# Patient Record
Sex: Male | Born: 1955 | Race: White | Hispanic: No | Marital: Married | Smoking: Never smoker
Health system: Southern US, Community
[De-identification: ages and names within clinical notes are randomized; demographics above are authoritative.]

## PROBLEM LIST (undated history)

## (undated) DIAGNOSIS — T7840XA Allergy, unspecified, initial encounter: Secondary | ICD-10-CM

## (undated) DIAGNOSIS — J189 Pneumonia, unspecified organism: Secondary | ICD-10-CM

## (undated) DIAGNOSIS — E785 Hyperlipidemia, unspecified: Secondary | ICD-10-CM

## (undated) DIAGNOSIS — J45909 Unspecified asthma, uncomplicated: Secondary | ICD-10-CM

## (undated) DIAGNOSIS — M1990 Unspecified osteoarthritis, unspecified site: Secondary | ICD-10-CM

## (undated) DIAGNOSIS — C61 Malignant neoplasm of prostate: Secondary | ICD-10-CM

## (undated) DIAGNOSIS — Z87442 Personal history of urinary calculi: Secondary | ICD-10-CM

## (undated) HISTORY — DX: Unspecified osteoarthritis, unspecified site: M19.90

## (undated) HISTORY — DX: Malignant neoplasm of prostate: C61

## (undated) HISTORY — DX: Unspecified asthma, uncomplicated: J45.909

## (undated) HISTORY — DX: Allergy, unspecified, initial encounter: T78.40XA

## (undated) HISTORY — PX: REPLACEMENT TOTAL KNEE: SUR1224

## (undated) HISTORY — PX: JOINT REPLACEMENT: SHX530

## (undated) HISTORY — PX: TONSILLECTOMY: SUR1361

## (undated) HISTORY — DX: Hyperlipidemia, unspecified: E78.5

---

## 2008-03-30 ENCOUNTER — Encounter: Admission: RE | Admit: 2008-03-30 | Discharge: 2008-03-30 | Payer: Self-pay | Admitting: Family Medicine

## 2009-08-15 IMAGING — US US ABDOMEN COMPLETE
1 series · 14 of 25 positions shown · non-contrast
Comparison: None available.

CLINICAL DATA: Abdominal fullness.  Weight loss.

COMPLETE ABDOMINAL ULTRASOUND
TECHNIQUE: Complete abdominal ultrasound examination was performed
including evaluation of the liver, gallbladder, bile ducts,
pancreas, kidneys, spleen, IVC, and abdominal aorta.

[Series 1: us abdomen complete · 0.26mm/px · 14 of 75 slices shown]
[im 1/75]
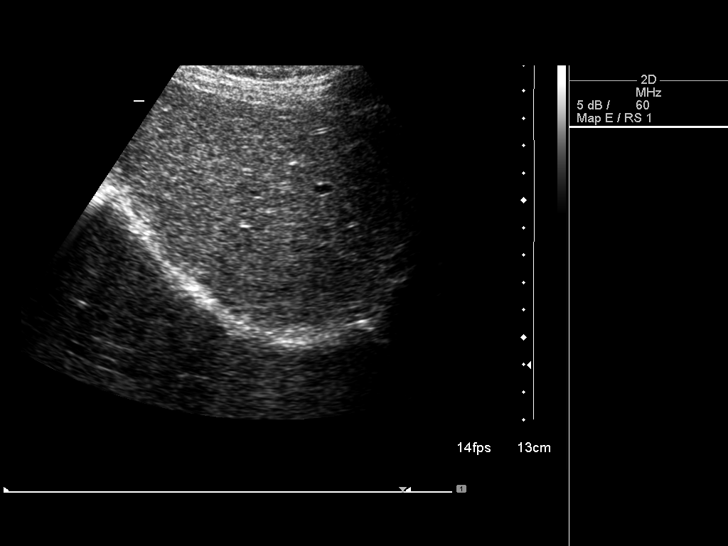
[im 7/75]
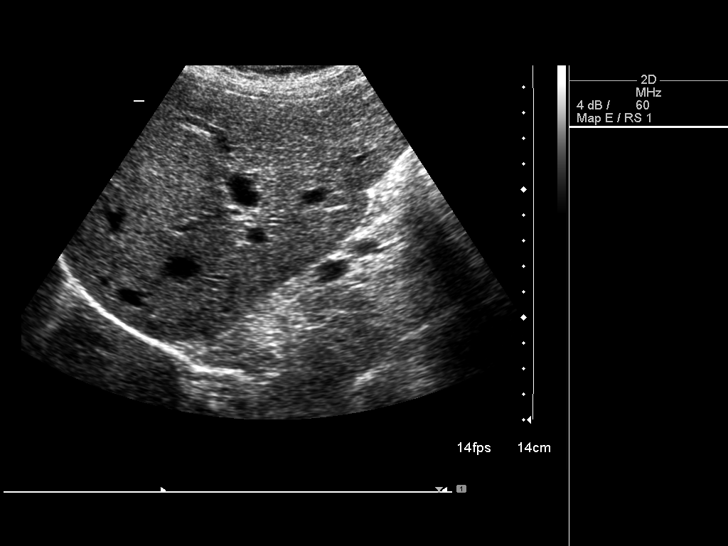
[im 13/75]
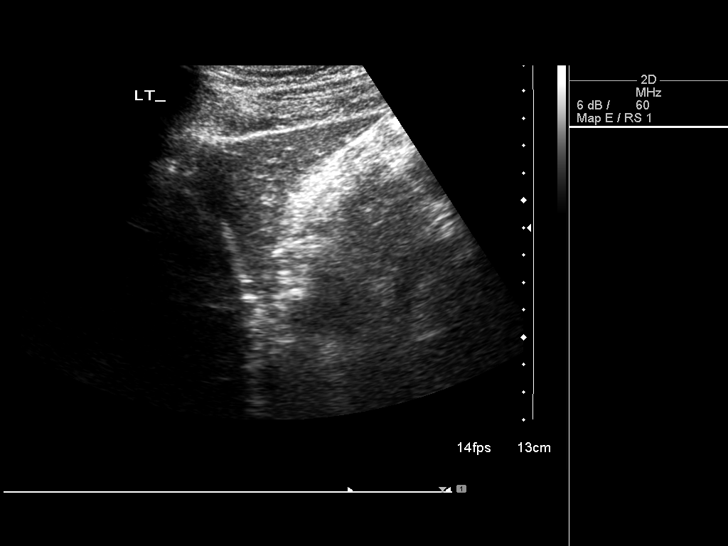
[im 19/75]
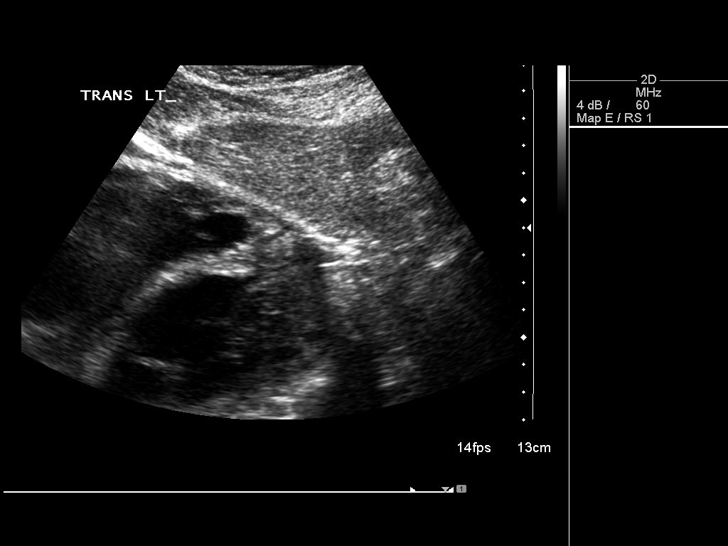
[im 25/75]
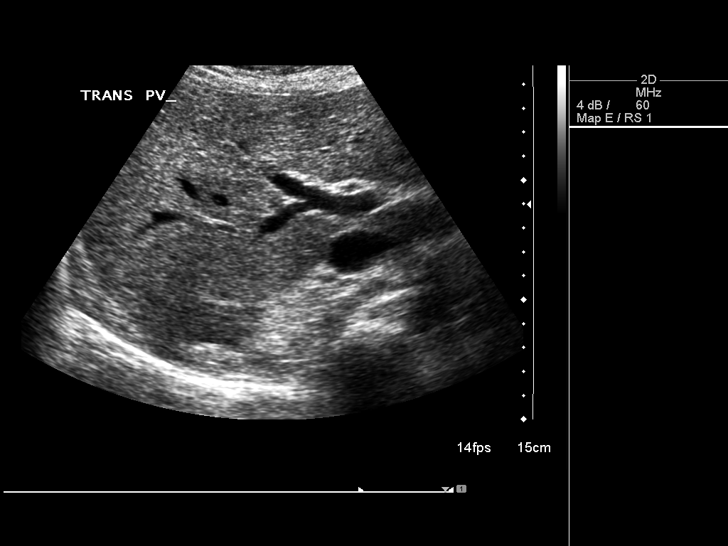
[im 28/75]
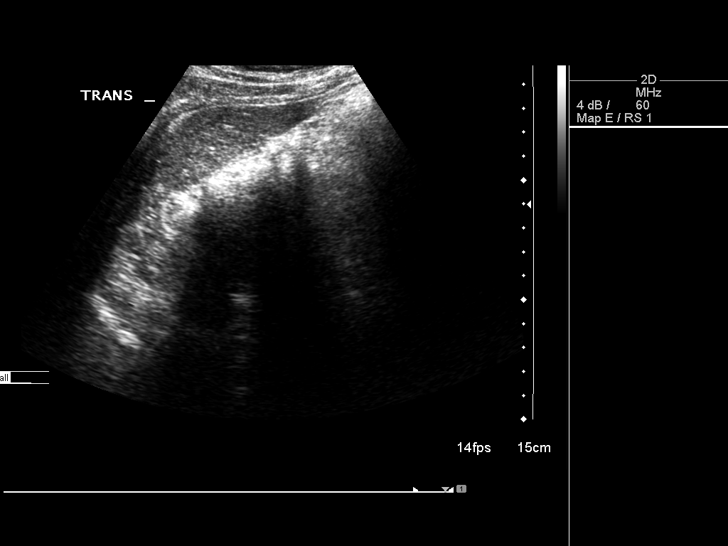
[im 34/75]
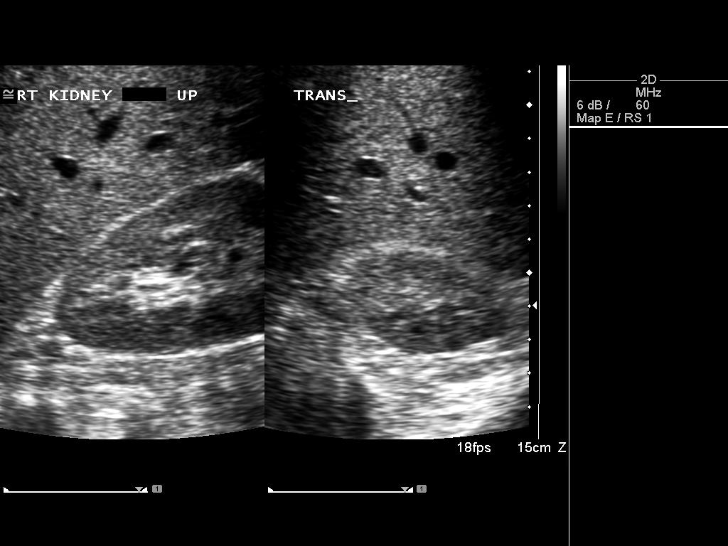
[im 41/75]
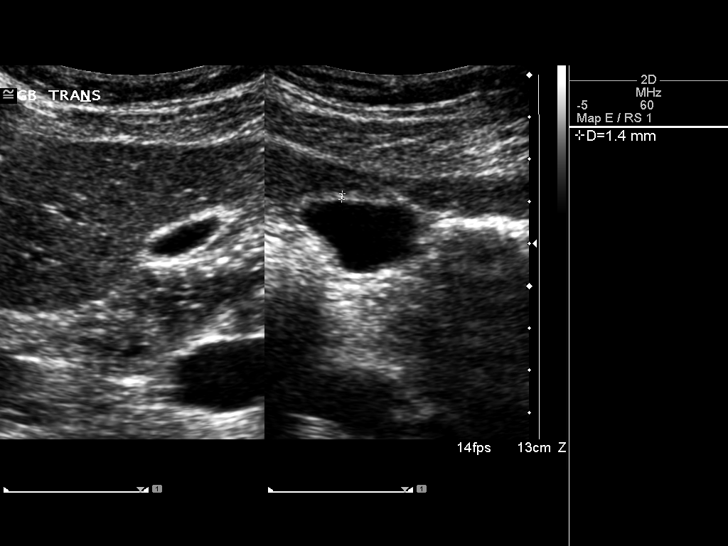
[im 47/75]
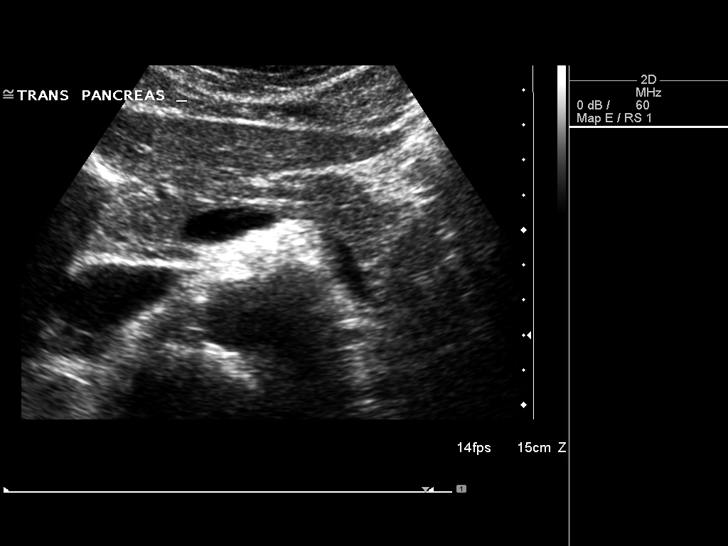
[im 50/75]
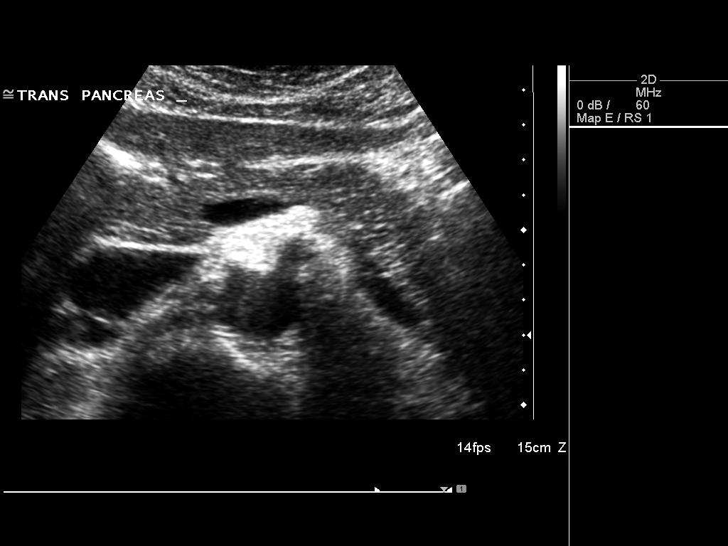
[im 56/75]
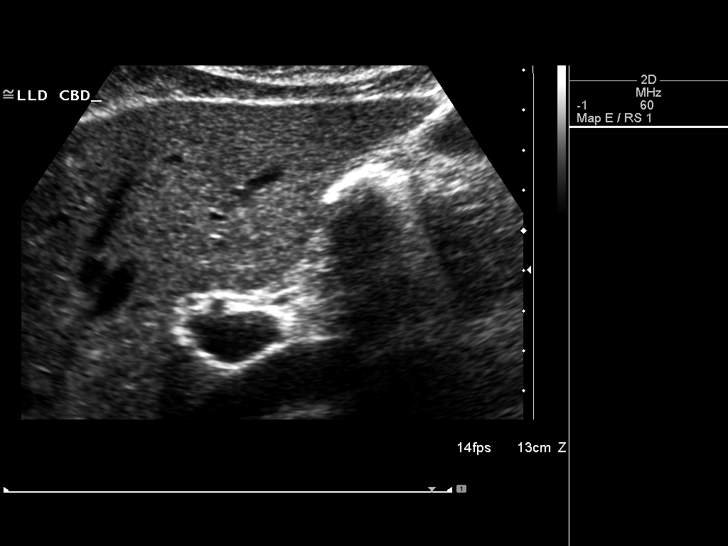
[im 62/75]
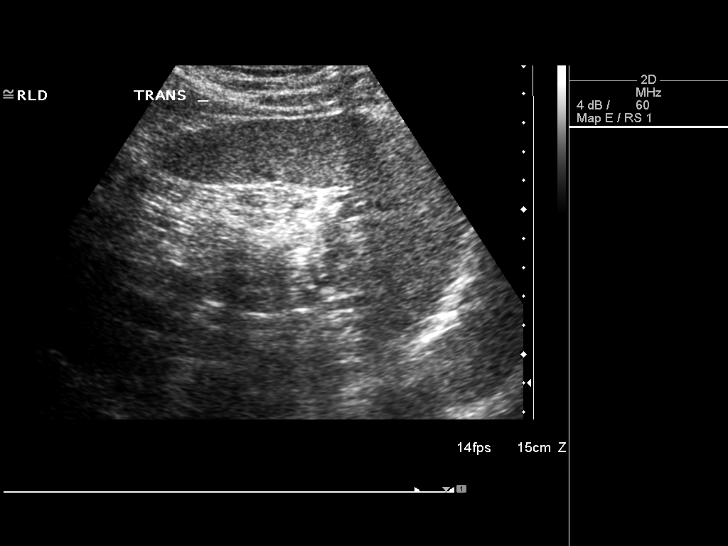
[im 68/75]
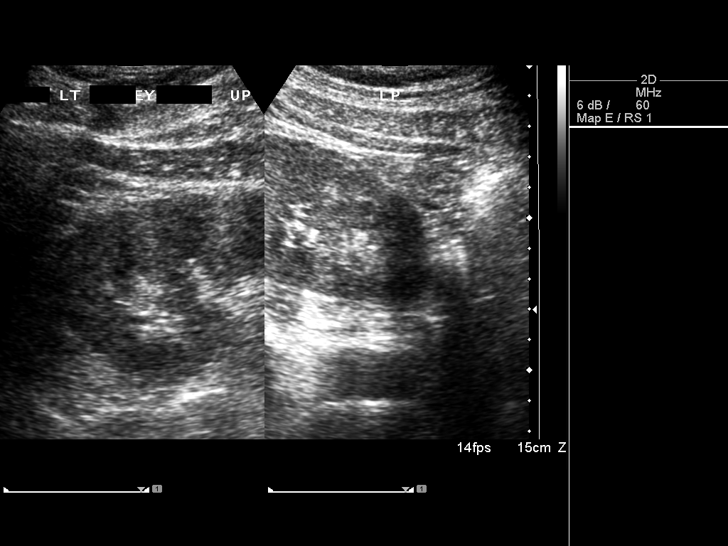
[im 75/75]
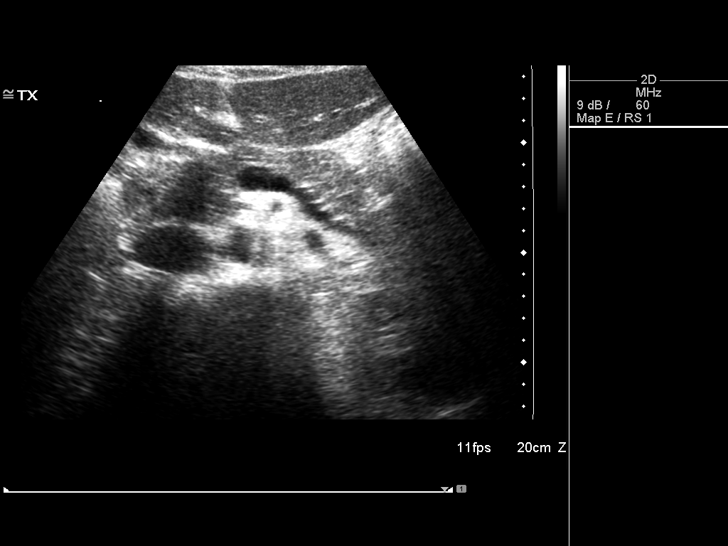

[14 of 25 positions shown; findings below may reference images not displayed]

FINDINGS: Gallbladder:  No gallstones, gallbladder wall thickening, or
pericholecystic fluid.

Common bile duct: Within normal limits in caliber measuring 2 mm in
diameter.

Liver:  No focal parenchymal abnormalities.  Within normal limits
in parenchymal echogenicity.

Inferior vena cava:  Visualized portion unremarkable.

Pancreas:  Visualized portion unremarkable.

Spleen:  Within normal limits in size and echogenicity.

Right kidney:  Within normal limits in size and echogenicity. No
evidence of mass or hydronephrosis. A 0.8 cm cyst in the lower pole
is incidentally noted.

Left kidney:  Within normal limits in size and echogenicity. No
evidence of mass or hydronephrosis.

Abdominal aorta:  Within normal limits in caliber measuring to a
maximum of 2.3 cm.
IMPRESSION: Negative abdominal ultrasound.

## 2009-08-29 LAB — HM COLONOSCOPY

## 2014-09-24 ENCOUNTER — Telehealth: Payer: Self-pay | Admitting: Internal Medicine

## 2014-09-24 ENCOUNTER — Ambulatory Visit (INDEPENDENT_AMBULATORY_CARE_PROVIDER_SITE_OTHER): Payer: Commercial Managed Care - PPO | Admitting: Internal Medicine

## 2014-09-24 ENCOUNTER — Encounter: Payer: Self-pay | Admitting: Internal Medicine

## 2014-09-24 VITALS — BP 121/79 | HR 76 | Temp 97.9°F | Ht 72.0 in | Wt 173.1 lb

## 2014-09-24 DIAGNOSIS — M1990 Unspecified osteoarthritis, unspecified site: Secondary | ICD-10-CM | POA: Insufficient documentation

## 2014-09-24 DIAGNOSIS — E785 Hyperlipidemia, unspecified: Secondary | ICD-10-CM

## 2014-09-24 DIAGNOSIS — Z0000 Encounter for general adult medical examination without abnormal findings: Secondary | ICD-10-CM | POA: Insufficient documentation

## 2014-09-24 DIAGNOSIS — J45909 Unspecified asthma, uncomplicated: Secondary | ICD-10-CM | POA: Insufficient documentation

## 2014-09-24 DIAGNOSIS — M1712 Unilateral primary osteoarthritis, left knee: Secondary | ICD-10-CM

## 2014-09-24 DIAGNOSIS — J4520 Mild intermittent asthma, uncomplicated: Secondary | ICD-10-CM

## 2014-09-24 HISTORY — DX: Unspecified asthma, uncomplicated: J45.909

## 2014-09-24 MED ORDER — BECLOMETHASONE DIPROPIONATE 80 MCG/ACT IN AERS: 2.0000 | INHALATION_SPRAY | Freq: Two times a day (BID) | RESPIRATORY_TRACT | Status: DC

## 2014-09-24 NOTE — Telephone Encounter (Signed)
Pt scheduled lab and CPE appointment in march due to MD availability. Pt would like to know if this is ok.

## 2014-09-24 NOTE — Progress Notes (Signed)
Pre visit review using our clinic review tool, if applicable. No additional management support is needed unless otherwise documented below in the visit note. 

## 2014-09-24 NOTE — Telephone Encounter (Signed)
If that is first available that is fine.

## 2014-09-24 NOTE — Assessment & Plan Note (Signed)
History of asthma as a child, this has been silent for years but in the last 2 falls he has noted a persisting cough. Cough variant  asthma? Plan: Qvar, see instructions.

## 2014-09-24 NOTE — Progress Notes (Signed)
Subjective:    Patient ID: Tommy Mcbride, male    DOB: 02/03/1956, 59 y.o.   MRN: [id number]  DOS:  09/24/2014 Type of visit - description : new pt  Interval history: Chief complaint is cough, this is the second Fall he has a persisting dry cough on and off throughout the day. Basically no symptoms at night. He has a history of asthma that has been inactive for years. Denies wheezing. Also would like to get labs for a physical exam  ROS Denies sinus pain or congestion, no itchy eyes or itchy nose. No chest pain or difficulty breathing No persistent GERD symptoms, occasional heartburn depending on diet. No sputum production  Past Medical History  Diagnosis Date  . 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 Hyperlipidemia   . DJD (degenerative joint disease)     L knee     Past Surgical History  Procedure Laterality Date  . Tonsillectomy      History   Social History  . Marital Status: Married    Spouse Name: N/A    Number of Children: 2  . Years of Education: N/A   Occupational History  . tennis pro    Social History Main Topics  . Smoking status: Never Smoker   . Smokeless tobacco: Never Used  . Alcohol Use: 0.0 oz/week    0 Not specified per week     Comment: Occasional  . Drug Use: Not on file  . Sexual Activity: Not on file   Other Topics Concern  . Not on file   Social History Narrative   Tennis pro, very active     Family History  Problem Relation Age of Onset  . CAD Neg Hx   . Stroke Father 2075  . Breast cancer Mother   . Diabetes Neg Hx   . Colon cancer Neg Hx   . Prostate cancer Neg Hx        Medication List       This list is accurate as of: 09/24/14 11:59 PM.  Always use your most recent med list.               beclomethasone 80 MCG/ACT inhaler  Commonly known as:  QVAR  Inhale 2 puffs into the lungs 2 (two) times daily.     MULTIVITAMIN PO  Take 1 tablet by mouth daily.     OSTEO BI-FLEX ADV DOUBLE ST PO  Take by  mouth. 1 tablet in am and 1 in pm     Policosanol 10 MG Caps  Take by mouth. In evenings     PSYLLIUM HUSK PO  Take by mouth. 1.5 tsps in am     SALMON OIL-1000 PO  Take by mouth. 1 in am and 1 in pm           Objective:   Physical Exam BP 121/79 mmHg  Pulse 76  Temp(Src) 97.9 F (36.6 C) (Oral)  Ht 6' (1.829 m)  Wt 173 lb 2 oz (78.529 kg)  BMI 23.47 kg/m2  SpO2 96%  General -- alert, well-developed, NAD.  HEENT-- Not pale.  R Ear-- normal L ear-- normal Throat symmetric, no redness or discharge. Face symmetric, sinuses not tender to palpation. Nose   congested. Lungs -- normal respiratory effort, no intercostal retractions, no accessory muscle use, and normal breath sounds.  Heart-- normal rate, regular rhythm, no murmur.  Abdomen-- Not distended, good bowel sounds,soft, non-tender. Extremities-- no pretibial edema  bilaterally  Neurologic--  alert & oriented X3. Speech normal, gait appropriate for age, strength symmetric and appropriate for age.  Psych-- Cognition and judgment appear intact. Cooperative with normal attention span and concentration. No anxious or depressed appearing.     Assessment & Plan:

## 2014-09-24 NOTE — Assessment & Plan Note (Signed)
Declined a flu shot Plan to do labs in preparation for his upcoming physical exam, plans to schedule that within few weeks

## 2014-09-24 NOTE — Patient Instructions (Signed)
Stop by the front desk and schedule labs to be done at your convenience  (fasting)   Please come back to the office at your convenience for a physical exam. No fasting   Front desk, please arrange within 4-5 weeks  Qvar 2 puffs twice a day for 3 or 4 weeks, then you can stop and restart if the cough resurface. If  is not helping let me know

## 2014-11-23 ENCOUNTER — Other Ambulatory Visit: Payer: Commercial Managed Care - PPO

## 2014-11-30 ENCOUNTER — Encounter: Payer: Commercial Managed Care - PPO | Admitting: Internal Medicine

## 2015-06-21 ENCOUNTER — Other Ambulatory Visit: Payer: Self-pay | Admitting: Internal Medicine

## 2015-06-21 MED ORDER — TRIAMCINOLONE ACETONIDE 0.1 % EX CREA: 1.0000 "application " | TOPICAL_CREAM | Freq: Two times a day (BID) | CUTANEOUS | Status: DC | PRN

## 2015-11-25 ENCOUNTER — Encounter: Payer: Self-pay | Admitting: *Deleted

## 2015-11-25 ENCOUNTER — Telehealth: Payer: Self-pay | Admitting: *Deleted

## 2015-11-25 NOTE — Telephone Encounter (Signed)
Pre-Visit Call completed with patient and chart updated.   Pre-Visit Info documented in Specialty Comments under Snap[employer]ot.    

## 2015-11-26 ENCOUNTER — Encounter: Payer: Self-pay | Admitting: Internal Medicine

## 2015-11-26 ENCOUNTER — Ambulatory Visit (INDEPENDENT_AMBULATORY_CARE_PROVIDER_SITE_OTHER): Payer: Commercial Managed Care - PPO | Admitting: Internal Medicine

## 2015-11-26 VITALS — BP 116/66 | HR 67 | Temp 97.9°F | Ht 72.0 in | Wt 173.4 lb

## 2015-11-26 DIAGNOSIS — Z23 Encounter for immunization: Secondary | ICD-10-CM | POA: Diagnosis not present

## 2015-11-26 DIAGNOSIS — Z09 Encounter for follow-up examination after completed treatment for conditions other than malignant neoplasm: Secondary | ICD-10-CM | POA: Insufficient documentation

## 2015-11-26 DIAGNOSIS — Z0000 Encounter for general adult medical examination without abnormal findings: Secondary | ICD-10-CM | POA: Diagnosis not present

## 2015-11-26 LAB — COMPREHENSIVE METABOLIC PANEL
ALBUMIN: 4.5 g/dL (ref 3.5–5.2)
ALK PHOS: 71 U/L (ref 39–117)
ALT: 17 U/L (ref 0–53)
AST: 19 U/L (ref 0–37)
BILIRUBIN TOTAL: 1.1 mg/dL (ref 0.2–1.2)
BUN: 16 mg/dL (ref 6–23)
CO2: 27 mEq/L (ref 19–32)
Calcium: 9.4 mg/dL (ref 8.4–10.5)
Chloride: 102 mEq/L (ref 96–112)
Creatinine, Ser: 1 mg/dL (ref 0.40–1.50)
GFR: 81.36 mL/min (ref >60.00)
Glucose, Bld: 96 mg/dL (ref 70–99)
Potassium: 4 mEq/L (ref 3.5–5.1)
SODIUM: 138 meq/L (ref 135–145)
TOTAL PROTEIN: 7 g/dL (ref 6.0–8.3)

## 2015-11-26 LAB — CBC WITH DIFFERENTIAL/PLATELET
BASOS ABS: 0 10*3/uL (ref 0.0–0.1)
Basophils Relative: 0.7 % (ref 0.0–3.0)
Eosinophils Absolute: 0.1 10*3/uL (ref 0.0–0.7)
Eosinophils Relative: 1.9 % (ref 0.0–5.0)
HEMATOCRIT: 45.5 % (ref 39.0–52.0)
Hemoglobin: 15.4 g/dL (ref 13.0–17.0)
LYMPHS ABS: 1.6 10*3/uL (ref 0.7–4.0)
LYMPHS PCT: 27.7 % (ref 12.0–46.0)
MCHC: 33.8 g/dL (ref 30.0–36.0)
MCV: 89.6 fl (ref 78.0–100.0)
MONOS PCT: 9.7 % (ref 3.0–12.0)
Monocytes Absolute: 0.6 10*3/uL (ref 0.1–1.0)
NEUTROS PCT: 60 % (ref 43.0–77.0)
Neutro Abs: 3.6 10*3/uL (ref 1.4–7.7)
Platelets: 272 10*3/uL (ref 150.0–400.0)
RBC: 5.08 Mil/uL (ref 4.22–5.81)
RDW: 13.6 % (ref 11.5–15.5)
WBC: 6 10*3/uL (ref 4.0–10.5)

## 2015-11-26 LAB — LIPID PANEL
Cholesterol: 263 mg/dL — ABNORMAL HIGH (ref 0–200)
HDL: 70.6 mg/dL (ref >39.00)
LDL Cholesterol: 177 mg/dL — ABNORMAL HIGH (ref 0–99)
NONHDL: 192.59
Total CHOL/HDL Ratio: 4
Triglycerides: 78 mg/dL (ref 0.0–149.0)
VLDL: 15.6 mg/dL (ref 0.0–40.0)

## 2015-11-26 LAB — HIV ANTIBODY (ROUTINE TESTING W REFLEX): HIV: NONREACTIVE

## 2015-11-26 LAB — TSH: TSH: 0.81 u[IU]/mL (ref 0.35–4.50)

## 2015-11-26 LAB — PSA: PSA: 0.87 ng/mL (ref 0.10–4.00)

## 2015-11-26 NOTE — Progress Notes (Signed)
Subjective:    Patient ID: Tommy Mcbride, male    DOB: 06-14-1956, 60 y.o.   MRN: [id number]  DOS:  11/26/2015 Type of visit - description :  CPX Interval history: no major concerns    Review of Systems  Constitutional: No fever. No chills. No unexplained wt changes. No unusual sweats  HEENT: No dental problems, no ear discharge, no facial swelling, no voice changes. No eye discharge, no eye  redness , no  intolerance to light   Respiratory: No wheezing , no  difficulty breathing. No cough , no mucus production  Cardiovascular: No CP, no leg swelling , no  Palpitations  GI: no nausea, no vomiting, no diarrhea , no  abdominal pain.  No blood in the stools. No dysphagia, no odynophagia    Endocrine: No polyphagia, no polyuria , no polydipsia  GU: No dysuria, gross hematuria, difficulty urinating. No urinary urgency, no frequency.  Musculoskeletal: No joint swellings or unusual aches or pains  Skin: No change in the color of the skin, palor , no  Rash  Allergic, immunologic: No environmental allergies , no  food allergies  Neurological: No dizziness no  syncope. No headaches. No diplopia, no slurred, no slurred speech, no motor deficits, no facial  Numbness  Hematological: No enlarged lymph nodes, no easy bruising , no unusual bleedings  Psychiatry: No suicidal ideas, no hallucinations, no beavior problems, no confusion.  No unusual/severe anxiety, no depression  Past Medical History  Diagnosis Date  . 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 Hyperlipidemia   . DJD (degenerative joint disease)     L knee     Past Surgical History  Procedure Laterality Date  . Tonsillectomy      Social History   Social History  . Marital Status: Married    Spouse Name: N/A  . Number of Children: 2  . Years of Education: N/A   Occupational History  . tennis pro    Social History Main Topics  . Smoking status: Never Smoker   . Smokeless tobacco: Never Used  .  Alcohol Use: 0.0 oz/week    0 Standard drinks or equivalent per week     Comment: Occasional  . Drug Use: Not on file  . Sexual Activity: Not on file   Other Topics Concern  . Not on file   Social History Narrative   Tennis pro, very active     Family History  Problem Relation Age of Onset  . CAD Neg Hx   . Stroke Father 31  . Breast cancer Mother   . Diabetes Neg Hx   . Colon cancer Neg Hx   . Prostate cancer Neg Hx        Medication List       This list is accurate as of: 11/26/15 12:10 PM.  Always use your most recent med list.               beclomethasone 80 MCG/ACT inhaler  Commonly known as:  QVAR  Inhale 2 puffs into the lungs daily as needed.     CHIA SEED PO  Take by mouth. 2 tbsp daily     NON FORMULARY  Seaweed/algae daily     OSTEO BI-FLEX ADV DOUBLE ST PO  Take by mouth. 1 tablet in am and 1 in pm     Policosanol 10 MG Caps  Take by mouth. 1 in am, 2 in pm     SALMON  OIL-1000 PO  Take by mouth. 2 in am and 2 in pm     triamcinolone cream 0.1 %  Commonly known as:  KENALOG  Apply 1 application topically 2 (two) times daily as needed.           Objective:   Physical Exam BP 116/66 mmHg  Pulse 67  Temp(Src) 97.9 F (36.6 C) (Oral)  Ht 6' (1.829 m)  Wt 173 lb 6 oz (78.642 kg)  BMI 23.51 kg/m2  SpO2 98%  General:   Well developed, well nourished . NAD.  Neck: No  thyromegaly , normal carotid pulse HEENT:  Normocephalic . Face symmetric, atraumatic Lungs:  CTA B Normal respiratory effort, no intercostal retractions, no accessory muscle use. Heart: RRR,  no murmur.  No pretibial edema bilaterally  Abdomen:  Not distended, soft, non-tender. No rebound or rigidity.   Rectal:  External abnormalities: none. Normal sphincter tone. No rectal masses or tenderness.  No stools  Prostate: Prostate gland firm and smooth, no enlargement, nodularity, tenderness, mass, asymmetry or induration.  Skin: Exposed areas without rash. Not pale.  Not jaundice Neurologic:  alert & oriented X3.  Speech normal, gait appropriate for age and unassisted Strength symmetric and appropriate for age.  Psych: Cognition and judgment appear intact.  Cooperative with normal attention span and concentration.  Behavior appropriate. No anxious or depressed appearing.    Assessment & Plan:   Assessment Hyperlipidemia--will be reluctant to take statins Asthma DJD  Dermatitis:Uses topical steroids as needed scrotal pain, 2014, saw urology, DX spermatocele, now asymptomatic  Labs from 2014: Total cholesterol 275, HDL 60, LDL 195. TSH, CBC, BMP, LFTs, vitamin D normal. aic 5.5 PSA 0.6  PLAN Hyperlipidemia: Check a FLP (NMR if chol very high), he has a family history of high cholesterol but also longevity,diet and exercise discussed. He would be opposed to take statins, Zetia? Asthma: Essentially asymptomatic, uses inhalers as needed RTC 1 year

## 2015-11-26 NOTE — Progress Notes (Signed)
Pre visit review using our clinic review tool, if applicable. No additional management support is needed unless otherwise documented below in the visit note. 

## 2015-11-26 NOTE — Assessment & Plan Note (Signed)
Hyperlipidemia: Check a FLP (NMR if chol very high), he has a family history of high cholesterol but also longevity,diet and exercise discussed. He would be opposed to take statins, Zetia? Asthma: Essentially asymptomatic, uses inhalers as needed RTC 1 year

## 2015-11-26 NOTE — Assessment & Plan Note (Addendum)
Td today; rec to consider flu shot yearly  CCS: (-) Cscope 2014, [facility], report scanned, next 10 years Prostate  cancer screening: DRE negative, check a PSA. EKG for baselien: NSR Labs: CBC, CMP,FLP, PSA, TSH, HIV Diet exercise discussed

## 2015-11-26 NOTE — Patient Instructions (Signed)
GO TO THE LAB : Get the blood work     GO TO THE FRONT DESK Schedule your next appointment for a physical  When? In 1 year Fasting? Yes

## 2015-12-04 ENCOUNTER — Other Ambulatory Visit: Payer: Self-pay | Admitting: Emergency Medicine

## 2015-12-04 ENCOUNTER — Telehealth: Payer: Self-pay | Admitting: Emergency Medicine

## 2015-12-04 DIAGNOSIS — E785 Hyperlipidemia, unspecified: Secondary | ICD-10-CM

## 2015-12-04 NOTE — Telephone Encounter (Signed)
LMOVM for pt to RTC, Dr. Drue NovelPaz wants patient to make a Lab Appt. To have NMR Lipid profile drawn. Future order placed in Epic....KMP

## 2015-12-05 ENCOUNTER — Telehealth: Payer: Self-pay | Admitting: Emergency Medicine

## 2015-12-05 NOTE — Telephone Encounter (Signed)
Patient has made Lab appt for 12/10/15....KMP

## 2015-12-10 ENCOUNTER — Other Ambulatory Visit (INDEPENDENT_AMBULATORY_CARE_PROVIDER_SITE_OTHER): Payer: Commercial Managed Care - PPO

## 2015-12-10 DIAGNOSIS — E785 Hyperlipidemia, unspecified: Secondary | ICD-10-CM

## 2015-12-10 LAB — LIPID PANEL
CHOLESTEROL: 249 mg/dL — AB (ref 0–200)
HDL: 37 mg/dL (ref 35–70)
LDL Cholesterol: 1839 mg/dL
TRIGLYCERIDES: 112 mg/dL (ref 40–160)

## 2015-12-13 ENCOUNTER — Encounter: Payer: Self-pay | Admitting: Internal Medicine

## 2015-12-13 ENCOUNTER — Telehealth: Payer: Self-pay | Admitting: Internal Medicine

## 2015-12-13 NOTE — Telephone Encounter (Signed)
We'll send the patient the following message through mychart:  Tommy Mcbride, the NMR show that your cholesterol is not only elevated is also very "sticky" (see below) , knowing that you have a very good lifestyle I don't think these will improve any by changing how you eat or how much much exercise. I recommend to start taking Lipitor 20 mg one tablet at bedtime and recheck your labs in 2 months: another NMR and your liver tests. Please let me know if you like to proceed with a medication and call if questions. Will mail a copy of the actual results  NMR: LDL particles 1839, high Small LDL particles 782 high Total cholesterol 249, LDL 161, HDL 66

## 2015-12-16 ENCOUNTER — Encounter: Payer: Self-pay | Admitting: Internal Medicine

## 2016-04-29 ENCOUNTER — Other Ambulatory Visit: Payer: Self-pay | Admitting: Internal Medicine

## 2016-04-29 MED ORDER — BECLOMETHASONE DIPROPIONATE 80 MCG/ACT IN AERS: 2.0000 | INHALATION_SPRAY | Freq: Every day | RESPIRATORY_TRACT | 3 refills | Status: DC | PRN

## 2017-06-28 ENCOUNTER — Encounter: Payer: Self-pay | Admitting: Internal Medicine

## 2017-06-28 ENCOUNTER — Ambulatory Visit (INDEPENDENT_AMBULATORY_CARE_PROVIDER_SITE_OTHER): Payer: PRIVATE HEALTH INSURANCE | Admitting: Internal Medicine

## 2017-06-28 VITALS — BP 116/64 | HR 84 | Temp 98.2°F | Resp 14 | Ht 72.0 in | Wt 176.2 lb

## 2017-06-28 DIAGNOSIS — E785 Hyperlipidemia, unspecified: Secondary | ICD-10-CM

## 2017-06-28 DIAGNOSIS — L03316 Cellulitis of umbilicus: Secondary | ICD-10-CM

## 2017-06-28 DIAGNOSIS — Z0001 Encounter for general adult medical examination with abnormal findings: Secondary | ICD-10-CM | POA: Diagnosis not present

## 2017-06-28 DIAGNOSIS — Z0000 Encounter for general adult medical examination without abnormal findings: Secondary | ICD-10-CM

## 2017-06-28 MED ORDER — DOXYCYCLINE HYCLATE 100 MG PO TABS: 100.0000 mg | ORAL_TABLET | Freq: Two times a day (BID) | ORAL | 0 refills | Status: DC

## 2017-06-28 NOTE — Assessment & Plan Note (Signed)
PLAN Hyperlipidemia: Today he was very clear that he will not take statins or Zetia. He plans to continue with his very healthy lifestyle. Checking labs Asthma: Not an issue recently Cellulitis: Has cellulitis near the umbilicus, etiology unclear, no abscess that I can tell, recommend doxycycline, call me if there is not a prompt response. Avoid excessive sun exposure while on antibiotics. He is concerned about his skin, he works outdoors, used to see dermatology, warning signs of melanoma discuss, he plans to see a dermatologist for screening, I agree. RTC one year

## 2017-06-28 NOTE — Progress Notes (Signed)
Subjective:    Patient ID: Tommy Mcbride, male    DOB: February 17, 1956, 61 y.o.   MRN: [id number]  DOS:  06/28/2017 Type of visit - description : cpx Interval history: Here for a CPX Also, a week ago noted red area near the umbilicus,  no pain  but is slightly sensitive. Has not seen any discharge, no injury or insect bite that he recalls.   Review of Systems  denies fever chills No nausea or vomiting No constipation  DJD pain at baseline  Other than above, a 14 point review of systems is negative      Past Medical History:  Diagnosis Date  . DJD (degenerative joint disease)    L knee   . Hyperlipidemia   . 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 TONSILLECTOMY      Social History   Social History  . Marital status: Married    Spouse name: N/A  . Number of children: 2  . Years of education: N/A   Occupational History  . tennis pro    Social History Main Topics  . Smoking status: Never Smoker  . Smokeless tobacco: Never Used  . Alcohol use 0.0 oz/week     Comment: Occasional  . Drug use: Unknown  . Sexual activity: Not on file   Other Topics Concern  . Not on file   Social History Narrative   Tennis pro, very active.     Family History  Problem Relation Age of Onset  . Stroke Father 53  . Breast cancer Mother   . CAD Neg Hx   . Diabetes Neg Hx   . Colon cancer Neg Hx   . Prostate cancer Neg Hx      Allergies as of 06/28/2017      Reactions   Penicillins Other (See Comments)   Unknown reaction       Medication List       Accurate as of 06/28/17  5:13 PM. Always use your most recent med list.          beclomethasone 80 MCG/ACT inhaler Commonly known as:  QVAR Inhale 2 puffs into the lungs daily as needed.   CHIA SEED PO Take by mouth. 2 tbsp daily   doxycycline 100 MG tablet Commonly known as:  VIBRA-TABS Take 1 tablet (100 mg total) by mouth 2 (two) times daily.   NON  FORMULARY Seaweed/algae daily   OSTEO BI-FLEX ADV DOUBLE ST PO Take by mouth. 1 tablet in am and 1 in pm   Policosanol 10 MG Caps Take by mouth. 1 in am, 2 in pm   SALMON OIL-1000 PO Take by mouth. 2 in am and 2 in pm   triamcinolone cream 0.1 % Commonly known as:  KENALOG Apply 1 application topically 2 (two) times daily as needed.          Objective:   Physical Exam BP 116/64 (BP Location: Left Arm, Patient Position: Sitting, Cuff Size: Small)   Pulse 84   Temp 98.2 F (36.8 C) (Oral)   Resp 14   Ht 6' (1.829 m)   Wt 176 lb 4 oz (79.9 kg)   SpO2 98%   BMI 23.90 kg/m   General:   Well developed, well nourished . NAD.  Neck: No  thyromegaly  HEENT:  Normocephalic . Face symmetric, atraumatic Lungs:  CTA B Normal respiratory effort, no intercostal retractions, no accessory  muscle use. Heart: RRR,  no murmur.  No pretibial edema bilaterally  Abdomen:  Not distended, soft, non-tender. No rebound or rigidity.   Has a small umbilical hernia ring,  has redness and induration without fluctuancy near the umbilicus as well, no discharge. See picture. Skin: Not pale. Not jaundice Neurologic:  alert & oriented X3.  Speech normal, gait appropriate for age and unassisted Strength symmetric and appropriate for age.  Psych: Cognition and judgment appear intact.  Cooperative with normal attention span and concentration.  Behavior appropriate. No anxious or depressed appearing.          Assessment & Plan:    Assessment Hyperlipidemia--will be reluctant to take statins/zetia Asthma DJD  Dermatitis:Uses topical steroids as needed scrotal pain, 2014, saw urology, DX spermatocele, now asymptomatic  PLAN Hyperlipidemia: Today he was very clear that he will not take statins or Zetia. He plans to continue with his very healthy lifestyle. Checking labs Asthma: Not an issue recently Cellulitis: Has cellulitis near the umbilicus, etiology unclear, no abscess that I can  tell, recommend doxycycline, call me if there is not a prompt response. Avoid excessive sun exposure while on antibiotics. He is concerned about his skin, he works outdoors, used to see dermatology, warning signs of melanoma discuss, he plans to see a dermatologist for screening, I agree. RTC one year

## 2017-06-28 NOTE — Progress Notes (Signed)
Pre visit review using our clinic review tool, if applicable. No additional management support is needed unless otherwise documented below in the visit note. 

## 2017-06-28 NOTE — Assessment & Plan Note (Signed)
-  Td 2017.  Discussed flu shot  -CCS: (-) Cscope 2014, Dr Rhetta Mura, report scanned, next 10 years -Prostate  cancer screening: DRE/PSA wnl 2017. -Labs: Will RTC fasting for CMP, FLP, CBC -Diet exercise discussed

## 2017-06-28 NOTE — Patient Instructions (Signed)
   GO TO THE FRONT DESK Schedule labs to be done fasting at your earliest convenience  Schedule your next appointment for a  Physical in 1 year  Take DOXY for 1 week. If the infection is not better or get worse, let me know Avoid excessive sun exposure  Consider see a dermatologist

## 2017-07-01 ENCOUNTER — Other Ambulatory Visit (INDEPENDENT_AMBULATORY_CARE_PROVIDER_SITE_OTHER): Payer: PRIVATE HEALTH INSURANCE

## 2017-07-01 DIAGNOSIS — Z0000 Encounter for general adult medical examination without abnormal findings: Secondary | ICD-10-CM

## 2017-07-01 LAB — CBC WITH DIFFERENTIAL/PLATELET
Basophils Absolute: 0.1 10*3/uL (ref 0.0–0.1)
Basophils Relative: 1.1 % (ref 0.0–3.0)
EOS PCT: 5.2 % — AB (ref 0.0–5.0)
Eosinophils Absolute: 0.3 10*3/uL (ref 0.0–0.7)
HCT: 44.5 % (ref 39.0–52.0)
Hemoglobin: 14.7 g/dL (ref 13.0–17.0)
LYMPHS ABS: 2 10*3/uL (ref 0.7–4.0)
Lymphocytes Relative: 34.9 % (ref 12.0–46.0)
MCHC: 33 g/dL (ref 30.0–36.0)
MCV: 92.9 fl (ref 78.0–100.0)
MONOS PCT: 10.9 % (ref 3.0–12.0)
Monocytes Absolute: 0.6 10*3/uL (ref 0.1–1.0)
NEUTROS PCT: 47.9 % (ref 43.0–77.0)
Neutro Abs: 2.8 10*3/uL (ref 1.4–7.7)
Platelets: 271 10*3/uL (ref 150.0–400.0)
RBC: 4.79 Mil/uL (ref 4.22–5.81)
RDW: 13.4 % (ref 11.5–15.5)
WBC: 5.8 10*3/uL (ref 4.0–10.5)

## 2017-07-01 LAB — COMPREHENSIVE METABOLIC PANEL
ALBUMIN: 4.1 g/dL (ref 3.5–5.2)
ALK PHOS: 76 U/L (ref 39–117)
ALT: 21 U/L (ref 0–53)
AST: 23 U/L (ref 0–37)
BILIRUBIN TOTAL: 1.2 mg/dL (ref 0.2–1.2)
BUN: 16 mg/dL (ref 6–23)
CALCIUM: 9.2 mg/dL (ref 8.4–10.5)
CO2: 28 mEq/L (ref 19–32)
Chloride: 104 mEq/L (ref 96–112)
Creatinine, Ser: 1.1 mg/dL (ref 0.40–1.50)
GFR: 72.49 mL/min (ref >60.00)
GLUCOSE: 88 mg/dL (ref 70–99)
Potassium: 3.6 mEq/L (ref 3.5–5.1)
Sodium: 139 mEq/L (ref 135–145)
TOTAL PROTEIN: 6.7 g/dL (ref 6.0–8.3)

## 2017-07-01 LAB — LIPID PANEL
Cholesterol: 249 mg/dL — ABNORMAL HIGH (ref 0–200)
HDL: 62 mg/dL (ref >39.00)
LDL Cholesterol: 173 mg/dL — ABNORMAL HIGH (ref 0–99)
NONHDL: 187
TRIGLYCERIDES: 68 mg/dL (ref 0.0–149.0)
Total CHOL/HDL Ratio: 4
VLDL: 13.6 mg/dL (ref 0.0–40.0)

## 2017-11-17 ENCOUNTER — Encounter: Payer: Self-pay | Admitting: Internal Medicine

## 2017-11-17 ENCOUNTER — Ambulatory Visit (INDEPENDENT_AMBULATORY_CARE_PROVIDER_SITE_OTHER): Payer: PRIVATE HEALTH INSURANCE | Admitting: Internal Medicine

## 2017-11-17 VITALS — BP 126/76 | HR 76 | Temp 98.6°F | Resp 14 | Ht 72.0 in | Wt 179.2 lb

## 2017-11-17 DIAGNOSIS — R972 Elevated prostate specific antigen [PSA]: Secondary | ICD-10-CM | POA: Diagnosis not present

## 2017-11-17 DIAGNOSIS — J45909 Unspecified asthma, uncomplicated: Secondary | ICD-10-CM

## 2017-11-17 DIAGNOSIS — R361 Hematospermia: Secondary | ICD-10-CM

## 2017-11-17 MED ORDER — BUDESONIDE-FORMOTEROL FUMARATE 80-4.5 MCG/ACT IN AERO: 2.0000 | INHALATION_SPRAY | Freq: Two times a day (BID) | RESPIRATORY_TRACT | 3 refills | Status: DC

## 2017-11-17 NOTE — Progress Notes (Signed)
Subjective:    Patient ID: Tommy Mcbride, male    DOB: Jul 28, 1956, 62 y.o.   MRN: [id number]  DOS:  11/17/2017 Type of visit - description : acute Interval history: 2 weeks ago had a viral syndrome that lasted about 48 hours with subjective fever, generalized aches, cough. After that, he developed sinus congestion. Since then symptoms are essentially resolved except for persistent cough, on and off. No further fevers.  Does have some chest congestion, cough is worse with exertion, not feeling short of breath.  No chest pain.  Also, 2 weeks ago saw red blood in the sperm, since then sperm is not  back to normal color, it looks brownish but getting lighter.  Review of Systems Denies any dysuria, gross hematuria, difficulty urinating. No testicular pain or swelling.   Past Medical History:  Diagnosis Date  . DJD (degenerative joint disease)    L knee   . Hyperlipidemia   . 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 TONSILLECTOMY      Social History   Socioeconomic History  . Marital status: Married    Spouse name: Not on file  . Number of children: 2  . Years of education: Not on file  . Highest education level: Not on file  Social Needs  . Financial resource strain: Not on file  . Food insecurity - worry: Not on file  . Food insecurity - inability: Not on file  . Transportation needs - medical: Not on file  . Transportation needs - non-medical: Not on file  Occupational History  . Occupation: tennis pro  Tobacco Use  . Smoking status: Never Smoker  . Smokeless tobacco: Never Used  Substance and Sexual Activity  . Alcohol use: Yes    Alcohol/week: 0.0 oz    Comment: Occasional  . Drug use: Not on file  . Sexual activity: Not on file  Other Topics Concern  . Not on file  Social History Narrative   Tennis pro, very active.      Allergies as of 11/17/2017      Reactions   Penicillins Other (See  Comments)   Unknown reaction       Medication List        Accurate as of 11/17/17 11:59 PM. Always use your most recent med list.          budesonide-formoterol 80-4.5 MCG/ACT inhaler Commonly known as:  SYMBICORT Inhale 2 puffs into the lungs 2 (two) times daily.   CHIA SEED PO Take by mouth. 2 tbsp daily   COD LIVER OIL PO Take by mouth.   NON FORMULARY Seaweed/algae daily   OSTEO BI-FLEX ADV DOUBLE ST PO Take by mouth. 1 tablet in am and 1 in pm   Policosanol 10 MG Caps Take by mouth. 1 in am, 2 in pm   QVAR REDIHALER 80 MCG/ACT inhaler Generic drug:  beclomethasone Inhale 2 puffs into the lungs daily as needed.   SALMON OIL-1000 PO Take by mouth. 2 in am and 2 in pm          Objective:   Physical Exam BP 126/76 (BP Location: Left Arm, Patient Position: Sitting, Cuff Size: Small)   Pulse 76   Temp 98.6 F (37 C) (Oral)   Resp 14   Ht 6' (1.829 m)   Wt 179 lb 4 oz (81.3 kg)   SpO2 96%   BMI 24.31 kg/m  General:   Well developed, well nourished . NAD.  HEENT:  Normocephalic . Face symmetric, atraumatic. TMs normal, throat symmetric, nose not congested Lungs:  Few rhonchi with cough only  Normal respiratory effort, no intercostal retractions, no accessory muscle use. Heart: RRR,  no murmur.  no pretibial edema bilaterally  Abdomen:  Not distended, soft, non-tender. No rebound or rigidity. DRE: Normal sphincter tone, no stools found, prostate: Right side normal, left side slightly enlarged and more sensitive to touch.  No nodularity, no fluctuant.  I did not feel the seminal vesicles. Skin: Not pale. Not jaundice Neurologic:  alert & oriented X3.  Speech normal, gait appropriate for age and unassisted Psych--  Cognition and judgment appear intact.  Cooperative with normal attention span and concentration.  Behavior appropriate. No anxious or depressed appearing.     Assessment & Plan:    Assessment Hyperlipidemia--will be reluctant to  take statins/zetia Asthma DJD  Dermatitis:Uses topical steroids as needed scrotal pain, 2014, saw urology, DX spermatocele, now asymptomatic  PLAN Cough: Patient with history of asthma with cough after a viral syndrome 2 weeks ago, cough worse with exertion, suspect bronchospasm. In the past, was prescribed Qvar but never pick it up due to cost which continued to be a issue with inhalers. Plan: Symbicort with a coupon for 2-3 weeks, then as needed.  Will call if not better, consider a round of antibiotics, ? prednisone ? rescue inhaler Hematospermia: Single episode of hematospermia 2 weeks ago, since then sperm is brownish in color but getting more clear in the last couple of days.  GU ROS is essentially negative.  DRE suspicious for prostatitis.  Plan: UA, urine culture, PSA.  Most likely will need antibiotics once we have the results.  Low threshold for urology referral.

## 2017-11-17 NOTE — Progress Notes (Signed)
Pre visit review using our clinic review tool, if applicable. No additional management support is needed unless otherwise documented below in the visit note. 

## 2017-11-17 NOTE — Patient Instructions (Addendum)
   Mucinex DM twice a day as needed  Symbicort: 2 puffs twice a day  Use it for 2-3  weeks, then you can actually use it as needed.  Call if you are not gradually improving

## 2017-11-18 LAB — URINE CULTURE
MICRO NUMBER:: 90288128
RESULT: NO GROWTH
SPECIMEN QUALITY: ADEQUATE

## 2017-11-18 LAB — URINALYSIS, ROUTINE W REFLEX MICROSCOPIC
Bilirubin Urine: NEGATIVE
Hgb urine dipstick: NEGATIVE
KETONES UR: NEGATIVE
LEUKOCYTES UA: NEGATIVE
Nitrite: NEGATIVE
PH: 6 (ref 5.0–8.0)
RBC / HPF: NONE SEEN (ref 0–?)
TOTAL PROTEIN, URINE-UPE24: NEGATIVE
UROBILINOGEN UA: 0.2 (ref 0.0–1.0)
Urine Glucose: NEGATIVE

## 2017-11-18 LAB — PSA: PSA: 1.23 ng/mL (ref 0.10–4.00)

## 2017-11-18 NOTE — Assessment & Plan Note (Signed)
Cough: Patient with history of asthma with cough after a viral syndrome 2 weeks ago, cough worse with exertion, suspect bronchospasm. In the past, was prescribed Qvar but never pick it up due to cost which continued to be a issue with inhalers. Plan: Symbicort with a coupon for 2-3 weeks, then as needed.  Will call if not better, consider a round of antibiotics, ? prednisone ? rescue inhaler Hematospermia: Single episode of hematospermia 2 weeks ago, since then sperm is brownish in color but getting more clear in the last couple of days.  GU ROS is essentially negative.  DRE suspicious for prostatitis.  Plan: UA, urine culture, PSA.  Most likely will need antibiotics once we have the results.  Low threshold for urology referral.

## 2017-11-19 ENCOUNTER — Telehealth: Payer: Self-pay | Admitting: Internal Medicine

## 2017-11-19 MED ORDER — CIPROFLOXACIN HCL 500 MG PO TABS: 500.0000 mg | ORAL_TABLET | Freq: Two times a day (BID) | ORAL | 0 refills | Status: DC

## 2017-11-19 NOTE — Telephone Encounter (Signed)
Reviewed results and physician's note with patient. He will begin taking the Cipro. He will call at a later time to schedule 3 month visit. Could not chart in results note as [facility]

## 2017-11-19 NOTE — Addendum Note (Signed)
Addended byConrad Glidden: Tommy Mcbride D on: 11/19/2017 04:05 PM   Modules accepted: Orders

## 2017-11-19 NOTE — Addendum Note (Signed)
Addended byConrad : Galan Ghee D on: 11/19/2017 04:47 PM   Modules accepted: Orders

## 2018-07-05 ENCOUNTER — Other Ambulatory Visit: Payer: Self-pay | Admitting: Emergency Medicine

## 2018-07-05 ENCOUNTER — Encounter: Payer: Self-pay | Admitting: Internal Medicine

## 2018-07-05 ENCOUNTER — Ambulatory Visit (INDEPENDENT_AMBULATORY_CARE_PROVIDER_SITE_OTHER): Payer: PRIVATE HEALTH INSURANCE | Admitting: Internal Medicine

## 2018-07-05 VITALS — BP 108/64 | HR 65 | Temp 98.4°F | Resp 16 | Ht 72.0 in | Wt 175.4 lb

## 2018-07-05 DIAGNOSIS — Z0000 Encounter for general adult medical examination without abnormal findings: Secondary | ICD-10-CM | POA: Diagnosis not present

## 2018-07-05 DIAGNOSIS — R82998 Other abnormal findings in urine: Secondary | ICD-10-CM | POA: Diagnosis not present

## 2018-07-05 DIAGNOSIS — Z1159 Encounter for screening for other viral diseases: Secondary | ICD-10-CM | POA: Diagnosis not present

## 2018-07-05 DIAGNOSIS — Z23 Encounter for immunization: Secondary | ICD-10-CM | POA: Diagnosis not present

## 2018-07-05 DIAGNOSIS — Z125 Encounter for screening for malignant neoplasm of prostate: Secondary | ICD-10-CM | POA: Diagnosis not present

## 2018-07-05 DIAGNOSIS — R319 Hematuria, unspecified: Secondary | ICD-10-CM

## 2018-07-05 LAB — LIPID PANEL
Cholesterol: 277 mg/dL — ABNORMAL HIGH (ref 0–200)
HDL: 68.5 mg/dL (ref >39.00)
LDL Cholesterol: 187 mg/dL — ABNORMAL HIGH (ref 0–99)
NONHDL: 208.26
Total CHOL/HDL Ratio: 4
Triglycerides: 104 mg/dL (ref 0.0–149.0)
VLDL: 20.8 mg/dL (ref 0.0–40.0)

## 2018-07-05 LAB — URINALYSIS, ROUTINE W REFLEX MICROSCOPIC
Bilirubin Urine: NEGATIVE
Ketones, ur: NEGATIVE
Nitrite: NEGATIVE
PH: 6.5 (ref 5.0–8.0)
SPECIFIC GRAVITY, URINE: 1.015 (ref 1.000–1.030)
TOTAL PROTEIN, URINE-UPE24: NEGATIVE
URINE GLUCOSE: NEGATIVE
UROBILINOGEN UA: 0.2 (ref 0.0–1.0)

## 2018-07-05 LAB — CBC WITH DIFFERENTIAL/PLATELET
Basophils Absolute: 0 10*3/uL (ref 0.0–0.1)
Basophils Relative: 0.8 % (ref 0.0–3.0)
EOS PCT: 5.1 % — AB (ref 0.0–5.0)
Eosinophils Absolute: 0.3 10*3/uL (ref 0.0–0.7)
HCT: 45 % (ref 39.0–52.0)
HEMOGLOBIN: 15.4 g/dL (ref 13.0–17.0)
Lymphocytes Relative: 26.3 % (ref 12.0–46.0)
Lymphs Abs: 1.5 10*3/uL (ref 0.7–4.0)
MCHC: 34.2 g/dL (ref 30.0–36.0)
MCV: 90.2 fl (ref 78.0–100.0)
MONO ABS: 0.6 10*3/uL (ref 0.1–1.0)
MONOS PCT: 10.2 % (ref 3.0–12.0)
Neutro Abs: 3.2 10*3/uL (ref 1.4–7.7)
Neutrophils Relative %: 57.6 % (ref 43.0–77.0)
Platelets: 284 10*3/uL (ref 150.0–400.0)
RBC: 4.98 Mil/uL (ref 4.22–5.81)
RDW: 13.3 % (ref 11.5–15.5)
WBC: 5.6 10*3/uL (ref 4.0–10.5)

## 2018-07-05 LAB — COMPREHENSIVE METABOLIC PANEL
ALBUMIN: 4.5 g/dL (ref 3.5–5.2)
ALK PHOS: 70 U/L (ref 39–117)
ALT: 20 U/L (ref 0–53)
AST: 18 U/L (ref 0–37)
BILIRUBIN TOTAL: 1.2 mg/dL (ref 0.2–1.2)
BUN: 19 mg/dL (ref 6–23)
CALCIUM: 9.3 mg/dL (ref 8.4–10.5)
CO2: 30 mEq/L (ref 19–32)
Chloride: 103 mEq/L (ref 96–112)
Creatinine, Ser: 1.05 mg/dL (ref 0.40–1.50)
GFR: 76.23 mL/min (ref >60.00)
GLUCOSE: 95 mg/dL (ref 70–99)
POTASSIUM: 3.9 meq/L (ref 3.5–5.1)
Sodium: 138 mEq/L (ref 135–145)
TOTAL PROTEIN: 6.9 g/dL (ref 6.0–8.3)

## 2018-07-05 LAB — PSA: PSA: 1.48 ng/mL (ref 0.10–4.00)

## 2018-07-05 LAB — TSH: TSH: 1.07 u[IU]/mL (ref 0.35–4.50)

## 2018-07-05 NOTE — Progress Notes (Signed)
Pre visit review using our clinic review tool, if applicable. No additional management support is needed unless otherwise documented below in the visit note. 

## 2018-07-05 NOTE — Progress Notes (Signed)
Subjective:    Patient ID: Tommy Mcbride, male    DOB: Oct 11, 1955, 62 y.o.   MRN: [id number]  DOS:  07/05/2018 Type of visit - description : CPX Interval history: Complete physical exam, no concerns   Review of Systems History of recent hematospermia, no further symptoms, no GU symptoms. History of asthma, no recent problems.  Other than above, a 14 point review of systems is negative    Past Medical History:  Diagnosis Date  . DJD (degenerative joint disease)    L knee   . Hyperlipidemia   . 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 TONSILLECTOMY      Social History   Socioeconomic History  . Marital status: Married    Spouse name: Not on file  . Number of children: 2  . Years of education: Not on file  . Highest education level: Not on file  Occupational History  . Occupation: tennis pro  Social Needs  . Financial resource strain: Not on file  . Food insecurity:    Worry: Not on file    Inability: Not on file  . Transportation needs:    Medical: Not on file    Non-medical: Not on file  Tobacco Use  . Smoking status: Never Smoker  . Smokeless tobacco: Never Used  Substance and Sexual Activity  . Alcohol use: Yes    Alcohol/week: 0.0 standard drinks    Comment: Occasional  . Drug use: Not on file  . Sexual activity: Not on file  Lifestyle  . Physical activity:    Days per week: Not on file    Minutes per session: Not on file  . Stress: Not on file  Relationships  . Social connections:    Talks on phone: Not on file    Gets together: Not on file    Attends religious service: Not on file    Active member of club or organization: Not on file    Attends meetings of clubs or organizations: Not on file    Relationship status: Not on file  . Intimate partner violence:    Fear of current or ex partner: Not on file    Emotionally abused: Not on file    Physically abused: Not on file    Forced  sexual activity: Not on file  Other Topics Concern  . Not on file  Social History Narrative   Tennis pro, very active.     Family History  Problem Relation Age of Onset  . Stroke Father 22  . Breast cancer Mother   . CAD Neg Hx   . Diabetes Neg Hx   . Colon cancer Neg Hx   . Prostate cancer Neg Hx      Allergies as of 07/05/2018      Reactions   Penicillins Other (See Comments)   Unknown reaction       Medication List        Accurate as of 07/05/18  6:33 PM. Always use your most recent med list.          budesonide-formoterol 80-4.5 MCG/ACT inhaler Commonly known as:  SYMBICORT Inhale 2 puffs into the lungs 2 (two) times daily.   CHIA SEED PO Take by mouth. 2 tbsp daily   OSTEO BI-FLEX ADV DOUBLE ST PO Take by mouth. 1 tablet in am and 1 in pm   Policosanol 10 MG Caps Take by mouth. 1  in am, 2 in pm   SALMON OIL-1000 PO Take by mouth. 2 in am and 2 in pm          Objective:   Physical Exam BP 108/64 (BP Location: Left Arm, Patient Position: Sitting, Cuff Size: Small)   Pulse 65   Temp 98.4 F (36.9 C) (Oral)   Resp 16   Ht 6' (1.829 m)   Wt 175 lb 6 oz (79.5 kg)   SpO2 96%   BMI 23.79 kg/m  General: Well developed, NAD, see BMI.  Neck: No  thyromegaly  HEENT:  Normocephalic . Face symmetric, atraumatic Lungs:  CTA B Normal respiratory effort, no intercostal retractions, no accessory muscle use. Heart: RRR,  no murmur.  No pretibial edema bilaterally  Abdomen:  Not distended, soft, non-tender. No rebound or rigidity.   Skin: Exposed areas without rash. Not pale. Not jaundice  Rectal: External abnormalities: none. Normal sphincter tone. No rectal masses or tenderness.  No  stools Prostate: Prostate gland firm and smooth, no enlargement, nodularity, tenderness, mass, asymmetry or induration Neurologic:  alert & oriented X3.  Speech normal, gait appropriate for age and unassisted Strength symmetric and appropriate for age.   Psych: Cognition and judgment appear intact.  Cooperative with normal attention span and concentration.  Behavior appropriate. No anxious or depressed appearing.     Assessment & Plan:   Assessment Hyperlipidemia--will be reluctant to take statins/zetia Asthma DJD  Dermatitis:Uses topical steroids as needed scrotal pain, 2014, saw urology, DX spermatocele, now asymptomatic  PLAN Asthma: On Symbicort as needed only, uses few days a year. Hematospermia: No further symptoms.  See comments under prostate cancer screening. RTC 1 year

## 2018-07-05 NOTE — Assessment & Plan Note (Signed)
-  Td 2017.  Flu shot today; shingrex discussed  -CCS: (-) Cscope 2014, Dr Rhetta Mura, report scanned, next 10 years -Prostate  cancer screening: Had hematospermia few months ago, DRE showed the left side of the prostate was slightly tender, DRE today normal.  For completeness we will check a PSA, UA. -Labs: CMP, FLP, CBC, TSH, PSA, hep C, UA -Diet exercise discussed: doing great

## 2018-07-05 NOTE — Assessment & Plan Note (Signed)
Asthma: On Symbicort as needed only, uses few days a year. Hematospermia: No further symptoms.  See comments under prostate cancer screening. RTC 1 year

## 2018-07-05 NOTE — Patient Instructions (Signed)
GO TO THE LAB : Get the blood work     GO TO THE FRONT DESK Schedule your next appointment for a  Physical exam in 1 year 

## 2018-07-06 ENCOUNTER — Other Ambulatory Visit: Payer: PRIVATE HEALTH INSURANCE

## 2018-07-06 DIAGNOSIS — R319 Hematuria, unspecified: Secondary | ICD-10-CM

## 2018-07-06 LAB — HEPATITIS C ANTIBODY
HEP C AB: NONREACTIVE
SIGNAL TO CUT-OFF: 0.03 (ref <1.00)

## 2018-07-07 LAB — URINE CULTURE
MICRO NUMBER:: 91274864
Result:: NO GROWTH
SPECIMEN QUALITY:: ADEQUATE

## 2018-07-11 NOTE — Addendum Note (Signed)
Addended byConrad [place] D on: 07/11/2018 08:07 AM   Modules accepted: Orders

## 2018-07-18 ENCOUNTER — Other Ambulatory Visit (INDEPENDENT_AMBULATORY_CARE_PROVIDER_SITE_OTHER): Payer: PRIVATE HEALTH INSURANCE

## 2018-07-18 DIAGNOSIS — R82998 Other abnormal findings in urine: Secondary | ICD-10-CM

## 2018-07-18 LAB — URINALYSIS, ROUTINE W REFLEX MICROSCOPIC
Bilirubin Urine: NEGATIVE
Hgb urine dipstick: NEGATIVE
Ketones, ur: NEGATIVE
LEUKOCYTES UA: NEGATIVE
NITRITE: NEGATIVE
PH: 6 (ref 5.0–8.0)
SPECIFIC GRAVITY, URINE: 1.02 (ref 1.000–1.030)
Total Protein, Urine: NEGATIVE
Urine Glucose: NEGATIVE
Urobilinogen, UA: 0.2 (ref 0.0–1.0)
WBC UA: NONE SEEN (ref 0–?)

## 2018-07-20 LAB — URINE CULTURE
MICRO NUMBER: 91323753
RESULT: NO GROWTH
SPECIMEN QUALITY: ADEQUATE

## 2018-12-03 ENCOUNTER — Other Ambulatory Visit: Payer: Self-pay | Admitting: Internal Medicine

## 2018-12-03 MED ORDER — BUDESONIDE-FORMOTEROL FUMARATE 80-4.5 MCG/ACT IN AERO: 2.0000 | INHALATION_SPRAY | Freq: Two times a day (BID) | RESPIRATORY_TRACT | 3 refills | Status: DC

## 2019-07-11 ENCOUNTER — Other Ambulatory Visit: Payer: Self-pay

## 2019-07-13 ENCOUNTER — Ambulatory Visit (INDEPENDENT_AMBULATORY_CARE_PROVIDER_SITE_OTHER): Payer: PRIVATE HEALTH INSURANCE | Admitting: Internal Medicine

## 2019-07-13 ENCOUNTER — Encounter: Payer: Self-pay | Admitting: Internal Medicine

## 2019-07-13 ENCOUNTER — Other Ambulatory Visit: Payer: Self-pay

## 2019-07-13 VITALS — BP 142/83 | HR 70 | Temp 97.7°F | Resp 16 | Ht 72.0 in | Wt 174.5 lb

## 2019-07-13 DIAGNOSIS — Z23 Encounter for immunization: Secondary | ICD-10-CM

## 2019-07-13 DIAGNOSIS — Z0000 Encounter for general adult medical examination without abnormal findings: Secondary | ICD-10-CM | POA: Diagnosis not present

## 2019-07-13 DIAGNOSIS — R002 Palpitations: Secondary | ICD-10-CM | POA: Diagnosis not present

## 2019-07-13 LAB — CBC WITH DIFFERENTIAL/PLATELET
Basophils Absolute: 0 10*3/uL (ref 0.0–0.1)
Basophils Relative: 0.9 % (ref 0.0–3.0)
Eosinophils Absolute: 0.2 10*3/uL (ref 0.0–0.7)
Eosinophils Relative: 3.2 % (ref 0.0–5.0)
HCT: 44.5 % (ref 39.0–52.0)
Hemoglobin: 14.8 g/dL (ref 13.0–17.0)
Lymphocytes Relative: 28.6 % (ref 12.0–46.0)
Lymphs Abs: 1.5 10*3/uL (ref 0.7–4.0)
MCHC: 33.2 g/dL (ref 30.0–36.0)
MCV: 91.7 fl (ref 78.0–100.0)
Monocytes Absolute: 0.6 10*3/uL (ref 0.1–1.0)
Monocytes Relative: 11.1 % (ref 3.0–12.0)
Neutro Abs: 3 10*3/uL (ref 1.4–7.7)
Neutrophils Relative %: 56.2 % (ref 43.0–77.0)
Platelets: 260 10*3/uL (ref 150.0–400.0)
RBC: 4.86 Mil/uL (ref 4.22–5.81)
RDW: 13.5 % (ref 11.5–15.5)
WBC: 5.3 10*3/uL (ref 4.0–10.5)

## 2019-07-13 LAB — COMPREHENSIVE METABOLIC PANEL
ALT: 17 U/L (ref 0–53)
AST: 20 U/L (ref 0–37)
Albumin: 4.4 g/dL (ref 3.5–5.2)
Alkaline Phosphatase: 74 U/L (ref 39–117)
BUN: 10 mg/dL (ref 6–23)
CO2: 29 mEq/L (ref 19–32)
Calcium: 9.1 mg/dL (ref 8.4–10.5)
Chloride: 102 mEq/L (ref 96–112)
Creatinine, Ser: 0.99 mg/dL (ref 0.40–1.50)
GFR: 76.51 mL/min (ref >60.00)
Glucose, Bld: 96 mg/dL (ref 70–99)
Potassium: 4.1 mEq/L (ref 3.5–5.1)
Sodium: 138 mEq/L (ref 135–145)
Total Bilirubin: 1 mg/dL (ref 0.2–1.2)
Total Protein: 6.6 g/dL (ref 6.0–8.3)

## 2019-07-13 LAB — LIPID PANEL
Cholesterol: 304 mg/dL — ABNORMAL HIGH (ref 0–200)
HDL: 67.9 mg/dL (ref >39.00)
LDL Cholesterol: 212 mg/dL — ABNORMAL HIGH (ref 0–99)
NonHDL: 236.19
Total CHOL/HDL Ratio: 4
Triglycerides: 119 mg/dL (ref 0.0–149.0)
VLDL: 23.8 mg/dL (ref 0.0–40.0)

## 2019-07-13 MED ORDER — BUDESONIDE-FORMOTEROL FUMARATE 80-4.5 MCG/ACT IN AERO: 2.0000 | INHALATION_SPRAY | Freq: Three times a day (TID) | RESPIRATORY_TRACT | 3 refills | Status: DC | PRN

## 2019-07-13 NOTE — Patient Instructions (Addendum)
GO TO THE LAB : Get the blood work    [place] Schedule a nurse visit for your second shingles shot in 2 to 3 months  Schedule your next appointment   for a physical exam in 1 year  Your blood pressure today is 142/83.  Please check your blood pressure once a month BP GOAL is between 110/65 and  135/85. If it is consistently higher or lower, let me know   Check your own cardiovascular risk [email or web address].[email or web address]

## 2019-07-13 NOTE — Progress Notes (Signed)
Subjective:    Patient ID: Tommy Mcbride, male    DOB: Feb 28, 1956, 63 y.o.   MRN: [id number]  DOS:  07/13/2019 Type of visit - description: CPX No major concerns. Did have some palpitations a couple of times described as "fast heart rate".  At the time he thinks he was using asthma inhalers.  Symptoms lasted less than 1 minute, no chest pain, difficulty breathing, diaphoresis. Request a EKG.    Review of Systems  Other than above, a 14 point review of systems is negative     Past Medical History:  Diagnosis Date  . DJD (degenerative joint disease)    L knee   . Hyperlipidemia   . 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 TONSILLECTOMY      Social History   Socioeconomic History  . Marital status: Married    Spouse name: Not on file  . Number of children: 2  . Years of education: Not on file  . Highest education level: Not on file  Occupational History  . Occupation: tennis pro  Social Needs  . Financial resource strain: Not on file  . Food insecurity    Worry: Not on file    Inability: Not on file  . Transportation needs    Medical: Not on file    Non-medical: Not on file  Tobacco Use  . Smoking status: Never Smoker  . Smokeless tobacco: Never Used  Substance and Sexual Activity  . Alcohol use: Yes    Alcohol/week: 0.0 standard drinks    Comment: Occasional  . Drug use: Not on file  . Sexual activity: Not on file  Lifestyle  . Physical activity    Days per week: Not on file    Minutes per session: Not on file  . Stress: Not on file  Relationships  . Social Musician on phone: Not on file    Gets together: Not on file    Attends religious service: Not on file    Active member of club or organization: Not on file    Attends meetings of clubs or organizations: Not on file    Relationship status: Not on file  . Intimate partner violence    Fear of current or ex partner: Not on  file    Emotionally abused: Not on file    Physically abused: Not on file    Forced sexual activity: Not on file  Other Topics Concern  . Not on file  Social History Narrative   Tennis pro, very active.   2 daughters    Rennis Golden, med school    [place]: grad school     Family History  Problem Relation Age of Onset  . Stroke Father 25  . Breast cancer Mother   . CAD Neg Hx   . Diabetes Neg Hx   . Colon cancer Neg Hx   . Prostate cancer Neg Hx      Allergies as of 07/13/2019      Reactions   Penicillins Other (See Comments)   Unknown reaction       Medication List       Accurate as of July 13, 2019  5:24 PM. If you have any questions, ask your nurse or doctor.        STOP taking these medications   CHIA SEED PO Stopped by: Willow Ora, MD     TAKE  these medications   budesonide-formoterol 80-4.5 MCG/ACT inhaler Commonly known as: SYMBICORT Inhale 2 puffs into the lungs 3 (three) times daily as needed. What changed:   when to take this  reasons to take this Changed by: Kathlene November, MD   OSTEO BI-FLEX ADV DOUBLE ST PO Take by mouth. 1 tablet in am and 1 in pm   Policosanol 10 MG Caps Take by mouth. 1 in am, 2 in pm   SALMON OIL-1000 PO Take by mouth. 2 in am and 2 in pm           Objective:   Physical Exam BP (!) 142/83 (BP Location: Left Arm, Patient Position: Sitting, Cuff Size: Small)   Pulse 70   Temp 97.7 F (36.5 C) (Temporal)   Resp 16   Ht 6' (1.829 m)   Wt 174 lb 8 oz (79.2 kg)   SpO2 98%   BMI 23.67 kg/m  General: Well developed, NAD, BMI noted Neck: No  thyromegaly  HEENT:  Normocephalic . Face symmetric, atraumatic Lungs:  CTA B Normal respiratory effort, no intercostal retractions, no accessory muscle use. Heart: RRR,  no murmur.  No pretibial edema bilaterally  Abdomen:  Not distended, soft, non-tender. No rebound or rigidity.   Skin: Exposed areas without rash. Not pale. Not jaundice Neurologic:  alert &  oriented X3.  Speech normal, gait appropriate for age and unassisted Strength symmetric and appropriate for age.  Psych: Cognition and judgment appear intact.  Cooperative with normal attention span and concentration.  Behavior appropriate. No anxious or depressed appearing.     Assessment     Assessment Hyperlipidemia--will be reluctant to take statins/zetia Asthma DJD  Dermatitis:Uses topical steroids as needed scrotal pain, 2014, saw urology, DX spermatocele, now asymptomatic  PLAN Here for CPX Hyperlipidemia: He won't take  medications, 10-year CV Risk calculated with last year cholesterol panel was only 12.8%.  Encourage patient to calculate his own risk, see AVS Palpitations: As described above, recommend observation EKG today NSR, no acute Asthma: Hardly ever uses Symbicort, printed prescription provided. DJD: Currently doing well, Osteo Bi-Flex MS helps.  He remains very active  RTC 1 year

## 2019-07-13 NOTE — Assessment & Plan Note (Signed)
Here for CPX Hyperlipidemia: He won't take  medications, 10-year CV Risk calculated with last year cholesterol panel was only 12.8%.  Encourage patient to calculate his own risk, see AVS Palpitations: As described above, recommend observation EKG today NSR, no acute Asthma: Hardly ever uses Symbicort, printed prescription provided. DJD: Currently doing well, Osteo Bi-Flex MS helps.  He remains very active  RTC 1 year

## 2019-07-13 NOTE — Assessment & Plan Note (Addendum)
-  Td 2017 - shingrex discussed, will proceed with first dose today - Flu shot today  -CCS: (-) Cscope 2014, Dr Erlene Quan, report scanned, next 10 years -Prostate  cancer screening: asx, last DRE, UA and PSA negative, reassess next year -Labs: CMP, FLP, CBC. -Diet exercise discussed: doing great

## 2019-07-13 NOTE — Progress Notes (Signed)
Pre visit review using our clinic review tool, if applicable. No additional management support is needed unless otherwise documented below in the visit note. 

## 2019-10-12 ENCOUNTER — Telehealth: Payer: Self-pay | Admitting: *Deleted

## 2019-10-12 NOTE — Telephone Encounter (Signed)
Left message on machine to call back for screening for appt tomorrow 

## 2019-10-13 ENCOUNTER — Other Ambulatory Visit: Payer: Self-pay

## 2019-10-13 ENCOUNTER — Ambulatory Visit (INDEPENDENT_AMBULATORY_CARE_PROVIDER_SITE_OTHER): Payer: PRIVATE HEALTH INSURANCE

## 2019-10-13 DIAGNOSIS — Z23 Encounter for immunization: Secondary | ICD-10-CM

## 2019-10-13 NOTE — Progress Notes (Signed)
Shingles vaccine

## 2020-07-18 ENCOUNTER — Ambulatory Visit (INDEPENDENT_AMBULATORY_CARE_PROVIDER_SITE_OTHER): Payer: PRIVATE HEALTH INSURANCE | Admitting: Internal Medicine

## 2020-07-18 ENCOUNTER — Other Ambulatory Visit: Payer: Self-pay

## 2020-07-18 ENCOUNTER — Encounter: Payer: Self-pay | Admitting: Internal Medicine

## 2020-07-18 VITALS — BP 122/83 | HR 67 | Temp 98.0°F | Resp 16 | Ht 72.0 in | Wt 174.0 lb

## 2020-07-18 DIAGNOSIS — E785 Hyperlipidemia, unspecified: Secondary | ICD-10-CM | POA: Diagnosis not present

## 2020-07-18 DIAGNOSIS — Z23 Encounter for immunization: Secondary | ICD-10-CM | POA: Diagnosis not present

## 2020-07-18 DIAGNOSIS — Z0000 Encounter for general adult medical examination without abnormal findings: Secondary | ICD-10-CM

## 2020-07-18 DIAGNOSIS — Z1211 Encounter for screening for malignant neoplasm of colon: Secondary | ICD-10-CM

## 2020-07-18 MED ORDER — TRIAMCINOLONE ACETONIDE 0.1 % EX CREA: 1.0000 "application " | TOPICAL_CREAM | Freq: Two times a day (BID) | CUTANEOUS | 1 refills | Status: DC | PRN

## 2020-07-18 NOTE — Progress Notes (Signed)
Subjective:    Patient ID: Tommy Mcbride, male    DOB: 04-03-1956, 64 y.o.   MRN: [id number]  DOS:  07/18/2020 Type of visit - description: CPX  In general feels well except for DJD. Takes ibuprofen regularly. Other than that he remains quite active and has no concerns   Review of Systems  Other than above, a 14 point review of systems is negative     Past Medical History:  Diagnosis Date  . DJD (degenerative joint disease)    L knee   . Hyperlipidemia   . 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 TONSILLECTOMY      Social History   Socioeconomic History  . Marital status: Married    Spouse name: Not on file  . Number of children: 2  . Years of education: Not on file  . Highest education level: Not on file  Occupational History  . Occupation: tennis pro  Tobacco Use  . Smoking status: Never Smoker  . Smokeless tobacco: Never Used  Substance and Sexual Activity  . Alcohol use: Yes    Alcohol/week: 0.0 standard drinks    Comment: Occasional  . Drug use: Not on file  . Sexual activity: Not on file  Other Topics Concern  . Not on file  Social History Narrative   Tennis pro, very active.   2 daughters    Erie Noe  MD   Kyla Balzarine: grad school psychology    Social Determinants of Health   Financial Resource Strain:   . Difficulty of Paying Living Expenses: Not on file  Food Insecurity:   . Worried About Programme researcher, broadcasting/film/video in the Last Year: Not on file  . Ran Out of Food in the Last Year: Not on file  Transportation Needs:   . Lack of Transportation (Medical): Not on file  . Lack of Transportation (Non-Medical): Not on file  Physical Activity:   . Days of Exercise per Week: Not on file  . Minutes of Exercise per Session: Not on file  Stress:   . Feeling of Stress : Not on file  Social Connections:   . Frequency of Communication with Friends and Family: Not on file  . Frequency of Social  Gatherings with Friends and Family: Not on file  . Attends Religious Services: Not on file  . Active Member of Clubs or Organizations: Not on file  . Attends Banker Meetings: Not on file  . Marital Status: Not on file  Intimate Partner Violence:   . Fear of Current or Ex-Partner: Not on file  . Emotionally Abused: Not on file  . Physically Abused: Not on file  . Sexually Abused: Not on file      Allergies as of 07/18/2020      Reactions   Penicillins Other (See Comments)   Unknown reaction       Medication List       Accurate as of July 18, 2020 11:59 PM. If you have any questions, ask your nurse or doctor.        B COMPLEX-C PO Take by mouth.   budesonide-formoterol 80-4.5 MCG/ACT inhaler Commonly known as: SYMBICORT Inhale 2 puffs into the lungs 3 (three) times daily as needed.   [employer] INDOOR/OUTDOOR ALLERGY RLF PO Take by mouth.   ibuprofen 200 MG tablet Commonly known as: ADVIL Take 200 mg by mouth every 6 (six) hours as needed.  Multi-Vitamin tablet Take 1 tablet by mouth daily.   OSTEO BI-FLEX JOINT SHIELD PO Take by mouth. What changed: Another medication with the same name was removed. Continue taking this medication, and follow the directions you see here. Changed by: Willow Ora, MD   Policosanol 10 MG Caps Take by mouth. 1 in am, 2 in pm What changed: Another medication with the same name was removed. Continue taking this medication, and follow the directions you see here. Changed by: Willow Ora, MD   SEA-OMEGA 30 PO Take by mouth. Algae What changed: Another medication with the same name was removed. Continue taking this medication, and follow the directions you see here. Changed by: Willow Ora, MD   triamcinolone cream 0.1 % Commonly known as: KENALOG Apply 1 application topically 2 (two) times daily as needed. Started by: Willow Ora, MD          Objective:   Physical Exam BP 122/83 (BP Location: Right Arm, Patient Position:  Sitting, Cuff Size: Normal)   Pulse 67   Temp 98 F (36.7 C) (Oral)   Resp 16   Ht 6' (1.829 m)   Wt 174 lb (78.9 kg)   SpO2 97%   BMI 23.60 kg/m  General: Well developed, NAD, BMI noted Neck: No  thyromegaly  HEENT:  Normocephalic . Face symmetric, atraumatic Lungs:  CTA B Normal respiratory effort, no intercostal retractions, no accessory muscle use. Heart: RRR,  no murmur.  Abdomen:  Not distended, soft, non-tender. No rebound or rigidity.   Lower extremities: no pretibial edema bilaterally  Skin: Exposed areas without rash. Not pale. Not jaundice DRE: Normal sphincter tone, no stools, prostate normal Neurologic:  alert & oriented X3.  Speech normal, gait appropriate for age, somewhat limited by DJD, unassisted Strength symmetric and appropriate for age.  Psych: Cognition and judgment appear intact.  Cooperative with normal attention span and concentration.  Behavior appropriate. No anxious or depressed appearing.     Assessment     Assessment Hyperlipidemia--will be reluctant to take statins/zetia Asthma DJD  Dermatitis:Uses topical steroids as needed scrotal pain, 2014, saw urology, DX spermatocele, now asymptomatic  PLAN Hyperlipidemia: Current 10-year CV risk 11.5%, recheck a FLP today, has a healthy lifestyle and remains reluctant to take medication. Asthma: Well-controlled, hardly ever uses inhaler Dermatitis, hemorrhoids, rarely uses a topical steroid, Rx sent DJD: To have a left TKR soon, on ibuprofen regularly, follows good GI precautions and has no GI symptoms RTC 1 year

## 2020-07-18 NOTE — Progress Notes (Signed)
Pre visit review using our clinic review tool, if applicable. No additional management support is needed unless otherwise documented below in the visit note. 

## 2020-07-18 NOTE — Patient Instructions (Signed)
   GO TO THE LAB : Get the blood work     GO TO THE FRONT DESK, PLEASE SCHEDULE YOUR APPOINTMENTS Come back for a physical exam in 1 year 

## 2020-07-19 ENCOUNTER — Encounter: Payer: Self-pay | Admitting: Internal Medicine

## 2020-07-19 LAB — CBC WITH DIFFERENTIAL/PLATELET
Absolute Monocytes: 505 cells/uL (ref 200–950)
Basophils Absolute: 29 cells/uL (ref 0–200)
Basophils Relative: 0.6 %
Eosinophils Absolute: 260 cells/uL (ref 15–500)
Eosinophils Relative: 5.3 %
HCT: 43.9 % (ref 38.5–50.0)
Hemoglobin: 15.1 g/dL (ref 13.2–17.1)
Lymphs Abs: 1460 cells/uL (ref 850–3900)
MCH: 31.9 pg (ref 27.0–33.0)
MCHC: 34.4 g/dL (ref 32.0–36.0)
MCV: 92.6 fL (ref 80.0–100.0)
MPV: 9.4 fL (ref 7.5–12.5)
Monocytes Relative: 10.3 %
Neutro Abs: 2646 cells/uL (ref 1500–7800)
Neutrophils Relative %: 54 %
Platelets: 240 10*3/uL (ref 140–400)
RBC: 4.74 10*6/uL (ref 4.20–5.80)
RDW: 11.9 % (ref 11.0–15.0)
Total Lymphocyte: 29.8 %
WBC: 4.9 10*3/uL (ref 3.8–10.8)

## 2020-07-19 LAB — COMPREHENSIVE METABOLIC PANEL
AG Ratio: 2.3 (calc) (ref 1.0–2.5)
ALT: 20 U/L (ref 9–46)
AST: 22 U/L (ref 10–35)
Albumin: 4.5 g/dL (ref 3.6–5.1)
Alkaline phosphatase (APISO): 60 U/L (ref 35–144)
BUN: 15 mg/dL (ref 7–25)
CO2: 27 mmol/L (ref 20–32)
Calcium: 9.3 mg/dL (ref 8.6–10.3)
Chloride: 104 mmol/L (ref 98–110)
Creat: 1.09 mg/dL (ref 0.70–1.25)
Globulin: 2 g/dL (calc) (ref 1.9–3.7)
Glucose, Bld: 99 mg/dL (ref 65–99)
Potassium: 4.3 mmol/L (ref 3.5–5.3)
Sodium: 139 mmol/L (ref 135–146)
Total Bilirubin: 1.1 mg/dL (ref 0.2–1.2)
Total Protein: 6.5 g/dL (ref 6.1–8.1)

## 2020-07-19 LAB — LIPID PANEL
Cholesterol: 276 mg/dL — ABNORMAL HIGH (ref <200)
HDL: 68 mg/dL (ref >=40)
LDL Cholesterol (Calc): 186 mg/dL (calc) — ABNORMAL HIGH
Non-HDL Cholesterol (Calc): 208 mg/dL (calc) — ABNORMAL HIGH (ref <130)
Total CHOL/HDL Ratio: 4.1 (calc) (ref <5.0)
Triglycerides: 102 mg/dL (ref <150)

## 2020-07-19 LAB — PSA: PSA: 0.92 ng/mL (ref <=4.0)

## 2020-07-19 LAB — TSH: TSH: 1.28 mIU/L (ref 0.40–4.50)

## 2020-07-19 NOTE — Assessment & Plan Note (Signed)
-  Td 2017 - s/p shingrex x2 - s/p covid vax x 2, booster discussed, his asthma is well controlled, he is otherwise healthy, booster discussed, he feels very safe as he is not exposed and uses a mask consistently. - Flu shot today  -CCS: (-) Cscope 2010  , Dr Rhetta Mura, next 10 years, GI referral sent -Prostate  cancer screening: DRE normal, check a PSA. - labs: CMP, FLP, CBC, TSH, PSA  -Diet exercise discussed: doing great

## 2020-07-19 NOTE — Assessment & Plan Note (Signed)
Hyperlipidemia: Current 10-year CV risk 11.5%, recheck a FLP today, has a healthy lifestyle and remains reluctant to take medication. Asthma: Well-controlled, hardly ever uses inhaler Dermatitis, hemorrhoids, rarely uses a topical steroid, Rx sent DJD: To have a left TKR soon, on ibuprofen regularly, follows good GI precautions and has no GI symptoms RTC 1 year

## 2020-07-21 ENCOUNTER — Telehealth: Payer: Self-pay | Admitting: Internal Medicine

## 2020-07-21 DIAGNOSIS — L600 Ingrowing nail: Secondary | ICD-10-CM

## 2020-07-21 NOTE — Telephone Encounter (Addendum)
Patient sent me a text about the colonoscopy referral.  He likes to proceed with his knee replacement first and then consider a colonoscopy or a Cologuard, that is completely appropriate, he will let me know.  Also has a ingrown toenail, request a podiatry referral @ [facility]). Please arrange   (Likes all further referrals to  [facility]  [facility] d/t his insurance).

## 2020-07-22 NOTE — Telephone Encounter (Signed)
GI referral cancelled. Podiatry referral placed to [facility].

## 2020-07-22 NOTE — Addendum Note (Signed)
Addended byConrad [place] D on: 07/22/2020 07:26 AM   Modules accepted: Orders

## 2020-07-22 NOTE — Addendum Note (Signed)
Addended byConrad [place] D on: 07/22/2020 07:30 AM   Modules accepted: Orders

## 2020-12-13 ENCOUNTER — Other Ambulatory Visit: Payer: Self-pay | Admitting: Internal Medicine

## 2021-01-31 ENCOUNTER — Telehealth: Payer: Self-pay

## 2021-01-31 NOTE — Telephone Encounter (Signed)
Pt called stating that his colonoscopy was postpone due to him having knee surgery in December and is now wondering if he can get a cologuard instead? Please advise. -JMA

## 2021-02-04 NOTE — Telephone Encounter (Signed)
Cologuard ordered

## 2021-02-04 NOTE — Telephone Encounter (Signed)
Please advise- okay for Cologuard?

## 2021-02-04 NOTE — Telephone Encounter (Signed)
No family history, no history of polyps. Okay to set up Cologuard

## 2021-02-11 LAB — COLOGUARD: Cologuard: NEGATIVE

## 2021-02-15 LAB — EXTERNAL GENERIC LAB PROCEDURE: COLOGUARD: NEGATIVE

## 2021-02-15 LAB — COLOGUARD: COLOGUARD: NEGATIVE

## 2021-02-17 ENCOUNTER — Encounter: Payer: Self-pay | Admitting: Internal Medicine

## 2021-05-09 ENCOUNTER — Telehealth: Payer: Self-pay

## 2021-05-09 DIAGNOSIS — R002 Palpitations: Secondary | ICD-10-CM

## 2021-05-09 NOTE — Telephone Encounter (Signed)
Patient went to urgent care, EKG reportedly okay, they recommended cardiology evaluation. Please check on the patient. Recommend a referral to cardiology in [facility] for palpitations.

## 2021-05-09 NOTE — Telephone Encounter (Signed)
Patient evaluated at [facility] on 05/08/2021.   [facility] - Client TELEPHONE ADVICE RECORD AccessNurse Patient Name:Tommy Mcbride Gender: Male DOB: December 16, 1955 Age: 65 Y 2 M 6 D Return Phone Number:[phone]([phone](Secondary) [facility] - Client Client Site [facility] - Day Physician Willow Ora - MD Contact Type Call Who Is Calling Patient / Member / Family / Caregiver Call Type Triage / Clinical Relationship To Patient Self Return Phone Number [phone] (Primary) Chief Complaint Heart palpitations or irregular heartbeat Reason for Call Symptomatic / Request for Health Information Initial Comment Caller states he is having heart palpitations. He states he has dizziness. Translation No Nurse Assessment Nurse: Elijah Birk, RN, Vernona Rieger Date/Time Lamount Cohen Time): 05/08/2021 4:23:26 PM Confirm and document reason for call. If symptomatic, describe symptoms. ---Caller states he is having heart palpitations. He states he has dizziness. Does the patient have any new or worsening symptoms? ---Yes Will a triage be completed? ---Yes Related visit to physician within the last 2 weeks? ---No Does the PT have any chronic conditions? (i.e. diabetes, asthma, this includes High risk factors for pregnancy, etc.) ---Yes List chronic conditions. ---Seasonal Asthma Is this a behavioral health or substance abuse call? ---No Guidelines Guideline Title Affirmed Question Affirmed Notes Nurse Date/Time (Eastern Time) Heart Rate and Heartbeat Questions Age > 60 years (Exception: brief heartbeat symptoms that went away and now feels well) Elijah Birk, RN, Vernona Rieger 05/08/2021 4:24:27 PM Disp. Time Lamount Cohen Time) Disposition Final User 05/08/2021 4:36:08 PM See HCP within 4 Hours (or PCP triage) Yes Elijah Birk, RN, Vernona Rieger PLEASE NOTE: All timestamps contained within this report are represented as [place] Standard  Time. CONFIDENTIALTY NOTICE: This fax transmission is intended only for the addressee. It contains information that is legally privileged, confidential or otherwise protected from use or disclosure. If you are not the intended recipient, you are strictly prohibited from reviewing, disclosing, copying using or disseminating any of this information or taking any action in reliance on or regarding this information. If you have received this fax in error, please notify us immediately by telephone so that we can arrange for its return to [place]. Phone: [phone], Toll-Free: [phone], Fax: [phone] Page: 2 of 2 Call Id: [id number] Caller Disagree/Comply Comply Caller Understands Yes PreDisposition Call Doctor Care Advice Given Per Guideline SEE HCP (OR PCP TRIAGE) WITHIN 4 HOURS: CALL BACK IF: * You become worse CARE ADVICE given per Heart Rate and Heartbeat Questions (Adult) guideline. Referrals GO TO FACILITY UNDECIDED

## 2021-05-09 NOTE — Telephone Encounter (Signed)
LMOM informing that we were calling to check on him. Informed PCP recommends to be seen at a [facility] cardiologist instead of [facility], referral placed. Instructed to call if questions/concerns.

## 2021-07-25 ENCOUNTER — Encounter: Payer: Self-pay | Admitting: Internal Medicine

## 2021-07-25 ENCOUNTER — Other Ambulatory Visit: Payer: Self-pay

## 2021-07-25 ENCOUNTER — Ambulatory Visit (INDEPENDENT_AMBULATORY_CARE_PROVIDER_SITE_OTHER): Payer: No Typology Code available for payment source | Admitting: Internal Medicine

## 2021-07-25 VITALS — BP 122/72 | HR 61 | Temp 98.1°F | Resp 16 | Ht 72.0 in | Wt 181.5 lb

## 2021-07-25 DIAGNOSIS — Z125 Encounter for screening for malignant neoplasm of prostate: Secondary | ICD-10-CM | POA: Diagnosis not present

## 2021-07-25 DIAGNOSIS — Z09 Encounter for follow-up examination after completed treatment for conditions other than malignant neoplasm: Secondary | ICD-10-CM

## 2021-07-25 DIAGNOSIS — Z0000 Encounter for general adult medical examination without abnormal findings: Secondary | ICD-10-CM | POA: Diagnosis not present

## 2021-07-25 DIAGNOSIS — E785 Hyperlipidemia, unspecified: Secondary | ICD-10-CM | POA: Diagnosis not present

## 2021-07-25 LAB — PSA: PSA: 1.58 ng/mL (ref 0.10–4.00)

## 2021-07-25 LAB — CBC WITH DIFFERENTIAL/PLATELET
Basophils Absolute: 0 10*3/uL (ref 0.0–0.1)
Basophils Relative: 0.9 % (ref 0.0–3.0)
Eosinophils Absolute: 0.3 10*3/uL (ref 0.0–0.7)
Eosinophils Relative: 5.6 % — ABNORMAL HIGH (ref 0.0–5.0)
HCT: 43 % (ref 39.0–52.0)
Hemoglobin: 14.4 g/dL (ref 13.0–17.0)
Lymphocytes Relative: 21.7 % (ref 12.0–46.0)
Lymphs Abs: 1.2 10*3/uL (ref 0.7–4.0)
MCHC: 33.5 g/dL (ref 30.0–36.0)
MCV: 90.8 fl (ref 78.0–100.0)
Monocytes Absolute: 0.7 10*3/uL (ref 0.1–1.0)
Monocytes Relative: 12.7 % — ABNORMAL HIGH (ref 3.0–12.0)
Neutro Abs: 3.2 10*3/uL (ref 1.4–7.7)
Neutrophils Relative %: 59.1 % (ref 43.0–77.0)
Platelets: 262 10*3/uL (ref 150.0–400.0)
RBC: 4.73 Mil/uL (ref 4.22–5.81)
RDW: 13.5 % (ref 11.5–15.5)
WBC: 5.4 10*3/uL (ref 4.0–10.5)

## 2021-07-25 LAB — COMPREHENSIVE METABOLIC PANEL
ALT: 19 U/L (ref 0–53)
AST: 23 U/L (ref 0–37)
Albumin: 4.3 g/dL (ref 3.5–5.2)
Alkaline Phosphatase: 76 U/L (ref 39–117)
BUN: 19 mg/dL (ref 6–23)
CO2: 28 mEq/L (ref 19–32)
Calcium: 8.7 mg/dL (ref 8.4–10.5)
Chloride: 104 mEq/L (ref 96–112)
Creatinine, Ser: 1.04 mg/dL (ref 0.40–1.50)
GFR: 75.94 mL/min (ref >60.00)
Glucose, Bld: 98 mg/dL (ref 70–99)
Potassium: 4 mEq/L (ref 3.5–5.1)
Sodium: 139 mEq/L (ref 135–145)
Total Bilirubin: 0.9 mg/dL (ref 0.2–1.2)
Total Protein: 6.6 g/dL (ref 6.0–8.3)

## 2021-07-25 LAB — LIPID PANEL
Cholesterol: 265 mg/dL — ABNORMAL HIGH (ref 0–200)
HDL: 62 mg/dL (ref >39.00)
LDL Cholesterol: 186 mg/dL — ABNORMAL HIGH (ref 0–99)
NonHDL: 202.53
Total CHOL/HDL Ratio: 4
Triglycerides: 84 mg/dL (ref 0.0–149.0)
VLDL: 16.8 mg/dL (ref 0.0–40.0)

## 2021-07-25 LAB — TSH: TSH: 1.18 u[IU]/mL (ref 0.35–5.50)

## 2021-07-25 NOTE — Progress Notes (Signed)
Subjective:    Patient ID: Tommy Mcbride, male    DOB: [birth year], 65 y.o.   MRN: [id number]  DOS:  07/25/2021 Type of visit - description: cpx  Since the last office visit he seems doing well and has no concerns. 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     Past Surgical History:  Procedure Laterality Date   REPLACEMENT TOTAL KNEE Left    ~08-2020   TONSILLECTOMY     Social History   Socioeconomic History   Marital status: Married    Spouse name: Not on file   Number of children: 2   Years of education: Not on file   Highest education level: Not on file  Occupational History   Occupation: tennis pro  Tobacco Use   Smoking status: Never   Smokeless tobacco: Never  Substance and Sexual Activity   Alcohol use: Yes    Alcohol/week: 0.0 standard drinks    Comment: Occasional   Drug use: Not on file   Sexual activity: Not on file  Other Topics Concern   Not on file  Social History Narrative   Tennis pro, very active.   2 daughters    Erie Noe  MD   Kyla Balzarine: grad school psychology    Social Determinants of Health   Financial Resource Strain: Not on file  Food Insecurity: Not on file  Transportation Needs: Not on file  Physical Activity: Not on file  Stress: Not on file  Social Connections: Not on file  Intimate Partner Violence: Not on file    Allergies as of 07/25/2021       Reactions   Penicillins Other (See Comments)   Unknown reaction         Medication List        Accurate as of July 25, 2021 11:59 PM. If you have any questions, ask your nurse or doctor.          B COMPLEX-C PO Take by mouth.   budesonide-formoterol 80-4.5 MCG/ACT inhaler Commonly known as: SYMBICORT Inhale  2 puffs into the lungs 3 (three) times daily as needed.   [employer] INDOOR/OUTDOOR ALLERGY RLF PO Take by mouth.   ibuprofen 200 MG tablet Commonly known as: ADVIL Take 200 mg by mouth every 6 (six) hours as needed.   Multi-Vitamin tablet Take 1 tablet by mouth daily.   OSTEO BI-FLEX JOINT SHIELD PO Take by mouth.   Policosanol 10 MG Caps Take by mouth. 1 in am, 2 in pm   SEA-OMEGA 30 PO Take by mouth. Algae   triamcinolone cream 0.1 % Commonly known as: KENALOG Apply 1 application topically 2 (two) times daily as needed.           Objective:   Physical Exam BP 122/72 (BP Location: Left Arm, Patient Position: Sitting, Cuff Size: Small)   Pulse 61   Temp 98.1 F (36.7 C) (Oral)   Resp 16   Ht 6' (1.829 m)   Wt 181 lb 8 oz (82.3 kg)   SpO2 98%   BMI 24.62 kg/m  General: Well developed, NAD, BMI noted Neck: No  thyromegaly  HEENT:  Normocephalic . Face symmetric, atraumatic Lungs:  CTA B Normal respiratory effort, no intercostal retractions, no accessory muscle use. Heart: RRR,  no murmur.  Abdomen:  Not distended, soft, non-tender. No rebound or rigidity.   Lower extremities: Bowing related to DJD noted Skin: Exposed areas without rash. Not pale. Not jaundice Neurologic:  alert & oriented X3.  Speech normal, gait appropriate for age and unassisted Strength symmetric and appropriate for age.  Psych: Cognition and judgment appear intact.  Cooperative with normal attention span and concentration.  Behavior appropriate. No anxious or depressed appearing.     Assessment      Assessment Hyperlipidemia--will be reluctant to take statins/zetia Asthma DJD  Dermatitis:Uses topical steroids as needed scrotal pain, 2014, saw urology, DX spermatocele, now asymptomatic  PLAN Here for CPX Hyperlipidemia: State he will not take statins. Asthma: On inhalers as needed DJD: Had a successful L knee replacement. Palpitations: Was seen at another office, symptoms  has decreased since then, to see cardiology soon. RTC 1 year   This visit occurred during the SARS-CoV-2 public health emergency.  Safety protocols were in place, including screening questions prior to the visit, additional usage of staff PPE, and extensive cleaning of exam room while observing appropriate contact time as indicated for disinfecting solutions.

## 2021-07-25 NOTE — Patient Instructions (Signed)
   GO TO THE LAB : Get the blood work     GO TO THE FRONT DESK, PLEASE SCHEDULE YOUR APPOINTMENTS Come back for   a physical exam in 1 year   "Living will", "Health Care Power of attorney": Advanced care planning  (If you already have a living will or healthcare power of attorney, please bring the copy to be scanned in your chart.)  Advance care planning is a process that supports adults in  understanding and sharing their preferences regarding future medical care.   The patient's preferences are recorded in documents called Advance Directives.    Advanced directives are completed (and can be modified at any time) while the patient is in full mental capacity.   The documentation should be available at all times to the patient, the family and the healthcare providers.  Bring in a copy to be scanned in your chart is an excellent idea and is recommended   This legal documents direct treatment decision making and/or appoint a surrogate to make the decision if the patient is not capable to do so.    Advance directives can be documented in many types of formats,  documents have names such as:  Lliving will  Durable power of attorney for healthcare (healthcare proxy or healthcare power of attorney)  Combined directives  Physician orders for life-sustaining treatment    More information at:  [email or web address]  

## 2021-07-26 ENCOUNTER — Encounter: Payer: Self-pay | Admitting: Internal Medicine

## 2021-07-26 NOTE — Assessment & Plan Note (Signed)
Here for CPX Hyperlipidemia: State he will not take statins. Asthma: On inhalers as needed DJD: Had a successful L knee replacement. Palpitations: Was seen at another office, symptoms has decreased since then, to see cardiology soon. RTC 1 year

## 2021-07-28 NOTE — Assessment & Plan Note (Addendum)
-  Td 2017 - s/p shingrex x2 - s/p covid vax bivalent booster 2 days ago - had a Flu shot    -CCS: (-) Cscope 2010  , Dr Rhetta Mura, [place] 01/2021 negative. -Prostate  cancer screening: DRE -PSA.wnl 2021 - labs: CMP, FLP, CBC, TSH, PSA  -Diet exercise: Doing great - POA: Discussed

## 2021-07-29 ENCOUNTER — Encounter: Payer: Self-pay | Admitting: Internal Medicine

## 2022-01-22 ENCOUNTER — Telehealth: Payer: Self-pay | Admitting: Internal Medicine

## 2022-01-22 MED ORDER — BUDESONIDE-FORMOTEROL FUMARATE 80-4.5 MCG/ACT IN AERO: 2.0000 | INHALATION_SPRAY | Freq: Three times a day (TID) | RESPIRATORY_TRACT | 5 refills | Status: DC | PRN

## 2022-01-22 NOTE — Telephone Encounter (Signed)
Rx sent in and left message on machine that rx was sent in. ?

## 2022-01-22 NOTE — Telephone Encounter (Signed)
Medication: budesonide-formoterol (SYMBICORT) 80-4.5 MCG/ACT inhaler  ? ? ?Has the patient contacted their pharmacy? No. ?(If no, request that the patient contact the pharmacy for the refill.) ? ? ?Preferred Pharmacy (with phone number or street name):  ?[facility] - [place], [address]  ?[place], [place] [address]  ? ?Marland Kitchen  ?

## 2022-03-12 DIAGNOSIS — E785 Hyperlipidemia, unspecified: Secondary | ICD-10-CM | POA: Diagnosis not present

## 2022-03-12 DIAGNOSIS — I471 Supraventricular tachycardia, unspecified: Secondary | ICD-10-CM | POA: Insufficient documentation

## 2022-03-12 DIAGNOSIS — Z79899 Other long term (current) drug therapy: Secondary | ICD-10-CM | POA: Diagnosis not present

## 2022-03-12 DIAGNOSIS — Z880 Allergy status to penicillin: Secondary | ICD-10-CM | POA: Diagnosis not present

## 2022-03-12 DIAGNOSIS — Q2549 Other congenital malformations of aorta: Secondary | ICD-10-CM | POA: Insufficient documentation

## 2022-03-12 DIAGNOSIS — E7800 Pure hypercholesterolemia, unspecified: Secondary | ICD-10-CM | POA: Diagnosis not present

## 2022-03-12 DIAGNOSIS — I7121 Aneurysm of the ascending aorta, without rupture: Secondary | ICD-10-CM | POA: Diagnosis not present

## 2022-04-10 DIAGNOSIS — H2513 Age-related nuclear cataract, bilateral: Secondary | ICD-10-CM | POA: Diagnosis not present

## 2022-06-19 ENCOUNTER — Encounter: Payer: Self-pay | Admitting: Internal Medicine

## 2022-06-19 DIAGNOSIS — Z0000 Encounter for general adult medical examination without abnormal findings: Secondary | ICD-10-CM

## 2022-06-19 DIAGNOSIS — E785 Hyperlipidemia, unspecified: Secondary | ICD-10-CM

## 2022-06-23 ENCOUNTER — Other Ambulatory Visit (INDEPENDENT_AMBULATORY_CARE_PROVIDER_SITE_OTHER): Payer: PRIVATE HEALTH INSURANCE

## 2022-06-23 ENCOUNTER — Ambulatory Visit (INDEPENDENT_AMBULATORY_CARE_PROVIDER_SITE_OTHER): Payer: PRIVATE HEALTH INSURANCE | Admitting: *Deleted

## 2022-06-23 ENCOUNTER — Encounter: Payer: Self-pay | Admitting: Internal Medicine

## 2022-06-23 DIAGNOSIS — Z0000 Encounter for general adult medical examination without abnormal findings: Secondary | ICD-10-CM | POA: Diagnosis not present

## 2022-06-23 DIAGNOSIS — Z23 Encounter for immunization: Secondary | ICD-10-CM

## 2022-06-23 DIAGNOSIS — E785 Hyperlipidemia, unspecified: Secondary | ICD-10-CM

## 2022-06-23 LAB — CBC WITH DIFFERENTIAL/PLATELET
Basophils Absolute: 0 10*3/uL (ref 0.0–0.1)
Basophils Relative: 0.7 % (ref 0.0–3.0)
Eosinophils Absolute: 0.3 10*3/uL (ref 0.0–0.7)
Eosinophils Relative: 4.3 % (ref 0.0–5.0)
HCT: 44.2 % (ref 39.0–52.0)
Hemoglobin: 14.8 g/dL (ref 13.0–17.0)
Lymphocytes Relative: 26.5 % (ref 12.0–46.0)
Lymphs Abs: 1.6 10*3/uL (ref 0.7–4.0)
MCHC: 33.5 g/dL (ref 30.0–36.0)
MCV: 92.3 fl (ref 78.0–100.0)
Monocytes Absolute: 0.6 10*3/uL (ref 0.1–1.0)
Monocytes Relative: 10.9 % (ref 3.0–12.0)
Neutro Abs: 3.4 10*3/uL (ref 1.4–7.7)
Neutrophils Relative %: 57.6 % (ref 43.0–77.0)
Platelets: 245 10*3/uL (ref 150.0–400.0)
RBC: 4.79 Mil/uL (ref 4.22–5.81)
RDW: 13.4 % (ref 11.5–15.5)
WBC: 5.9 10*3/uL (ref 4.0–10.5)

## 2022-06-23 LAB — COMPREHENSIVE METABOLIC PANEL
ALT: 26 U/L (ref 0–53)
AST: 24 U/L (ref 0–37)
Albumin: 4.3 g/dL (ref 3.5–5.2)
Alkaline Phosphatase: 74 U/L (ref 39–117)
BUN: 18 mg/dL (ref 6–23)
CO2: 28 mEq/L (ref 19–32)
Calcium: 9.2 mg/dL (ref 8.4–10.5)
Chloride: 102 mEq/L (ref 96–112)
Creatinine, Ser: 1.05 mg/dL (ref 0.40–1.50)
GFR: 74.6 mL/min (ref >60.00)
Glucose, Bld: 97 mg/dL (ref 70–99)
Potassium: 4 mEq/L (ref 3.5–5.1)
Sodium: 138 mEq/L (ref 135–145)
Total Bilirubin: 1.2 mg/dL (ref 0.2–1.2)
Total Protein: 6.8 g/dL (ref 6.0–8.3)

## 2022-06-23 LAB — LIPID PANEL
Cholesterol: 224 mg/dL — ABNORMAL HIGH (ref 0–200)
HDL: 71.5 mg/dL (ref >39.00)
LDL Cholesterol: 136 mg/dL — ABNORMAL HIGH (ref 0–99)
NonHDL: 152.21
Total CHOL/HDL Ratio: 3
Triglycerides: 83 mg/dL (ref 0.0–149.0)
VLDL: 16.6 mg/dL (ref 0.0–40.0)

## 2022-06-23 NOTE — Progress Notes (Signed)
Patient here for high dose flu vaccine  Vaccine given in left deltoid and patient tolerated. Well.

## 2022-06-24 LAB — PSA: PSA: 1.81 ng/mL (ref 0.10–4.00)

## 2022-07-31 ENCOUNTER — Encounter: Payer: No Typology Code available for payment source | Admitting: Internal Medicine

## 2022-08-21 DIAGNOSIS — H524 Presbyopia: Secondary | ICD-10-CM | POA: Diagnosis not present

## 2022-08-28 DIAGNOSIS — M25462 Effusion, left knee: Secondary | ICD-10-CM | POA: Diagnosis not present

## 2022-08-28 DIAGNOSIS — Z471 Aftercare following joint replacement surgery: Secondary | ICD-10-CM | POA: Diagnosis not present

## 2022-08-28 DIAGNOSIS — Z880 Allergy status to penicillin: Secondary | ICD-10-CM | POA: Diagnosis not present

## 2022-08-28 DIAGNOSIS — Z96652 Presence of left artificial knee joint: Secondary | ICD-10-CM | POA: Diagnosis not present

## 2022-09-18 ENCOUNTER — Ambulatory Visit (INDEPENDENT_AMBULATORY_CARE_PROVIDER_SITE_OTHER): Payer: Medicare Other | Admitting: Internal Medicine

## 2022-09-18 ENCOUNTER — Encounter: Payer: Self-pay | Admitting: Internal Medicine

## 2022-09-18 VITALS — BP 136/68 | HR 82 | Temp 98.1°F | Resp 16 | Ht 72.0 in | Wt 184.5 lb

## 2022-09-18 DIAGNOSIS — R399 Unspecified symptoms and signs involving the genitourinary system: Secondary | ICD-10-CM | POA: Diagnosis not present

## 2022-09-18 DIAGNOSIS — E785 Hyperlipidemia, unspecified: Secondary | ICD-10-CM

## 2022-09-18 DIAGNOSIS — Z0000 Encounter for general adult medical examination without abnormal findings: Secondary | ICD-10-CM | POA: Diagnosis not present

## 2022-09-18 NOTE — Patient Instructions (Addendum)
Vaccines I recommend:  Pneumonia shot "20" RSV vaccine  Consider aspirin 81 mg a day     GO TO THE FRONT DESK, PLEASE SCHEDULE YOUR APPOINTMENTS Come back for  blood work next week, fasting   Come back for  a physical in 1 year      "[place] of attorney" ,  "Living will" (Advance care planning documents)  If you already have a living will or healthcare power of attorney, is recommended you bring the copy to be scanned in your chart.   The document will be available to all the doctors you see in the system.  Advance care planning is a process that supports adults in  understanding and sharing their preferences regarding future medical care.  The patient's preferences are recorded in documents called Advance Directives and the can be modified at any time while the patient is in full mental capacity.   If you don't have one, please consider create one.      More information at: [email or web address]

## 2022-09-18 NOTE — Progress Notes (Unsigned)
Subjective:    Patient ID: Tommy Mcbride, male    DOB: 08-25-1956, 67 y.o.   MRN: [id number]  DOS:  09/18/2022 Type of visit - description: cpx  Here for CPX.  Feeling great.  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     Past Surgical History:  Procedure Laterality Date   REPLACEMENT TOTAL KNEE Left    ~08-2020   TONSILLECTOMY     Social History   Socioeconomic History   Marital status: Married    Spouse name: Not on file   Number of children: 2   Years of education: Not on file   Highest education level: Not on file  Occupational History   Occupation: tennis pro  Tobacco Use   Smoking status: Never   Smokeless tobacco: Never  Substance and Sexual Activity   Alcohol use: Yes    Alcohol/week: 0.0 standard drinks of alcohol    Comment: Occasional   Drug use: Not on file   Sexual activity: Not on file  Other Topics Concern   Not on file  Social History Narrative   Tennis pro, very active.   2 daughters    Lorriane Shire  MD   Dan Europe: grad school psychology    Social Determinants of Health   Financial Resource Strain: Not on file  Food Insecurity: Not on file  Transportation Needs: Not on file  Physical Activity: Not on file  Stress: Not on file  Social Connections: Not on file  Intimate Partner Violence: Not on file    Current Outpatient Medications  Medication Instructions   B COMPLEX-C PO Oral   budesonide-formoterol (SYMBICORT) 80-4.5 MCG/ACT inhaler 2 puffs, Inhalation, 3 times daily PRN   Cetirizine HCl ([employer] INDOOR/OUTDOOR ALLERGY RLF PO) Oral   ibuprofen (ADVIL) 200 mg, Every 6 hours PRN   Multiple Vitamin (MULTI-VITAMIN) tablet 1 tablet, Oral, Daily   Omega-3 Fatty Acids (SEA-OMEGA 30 PO) Oral, Algae    Red Yeast Rice Extract (RED YEAST RICE PO) Oral, Once a day   triamcinolone  cream (KENALOG) 0.1 % 1 application , Topical, 2 times daily PRN       Objective:   Physical Exam BP 136/68   Pulse 82   Temp 98.1 F (36.7 C) (Oral)   Resp 16   Ht 6' (1.829 m)   Wt 184 lb 8 oz (83.7 kg)   SpO2 97%   BMI 25.02 kg/m  General: Well developed, NAD, BMI noted Neck: No  thyromegaly  HEENT:  Normocephalic . Face symmetric, atraumatic Lungs:  CTA B Normal respiratory effort, no intercostal retractions, no accessory muscle use. Heart: RRR,  no murmur.  Abdomen:  Not distended, soft, non-tender. No rebound or rigidity.   Lower extremities: no pretibial edema bilaterally  Skin: Exposed areas without rash. Not pale. Not jaundice Neurologic:  alert & oriented X3.  Speech normal, gait appropriate for age and unassisted Strength symmetric and appropriate for age.  Psych: Cognition and judgment appear intact.  Cooperative with normal attention span and concentration.  Behavior appropriate. No anxious or depressed appearing.     Assessment    Assessment Hyperlipidemia--will be reluctant to take statins/zetia Asthma DJD  Dermatitis:Uses topical steroids as needed scrotal pain, 2014, saw urology, DX spermatocele, now asymptomatic CV:Saw cardiology 2023, -  Zio patch shows some episode of SVT. -Echo: EF 55 to 60%, mildly dilated aortic sinus.  Repeated TTE 02/2022: Similar findings -Coronary calcium score July 2023:"total coronary artery calcium score is 119, which places the patient in the 58th percentile"    PLAN Here for CPX CV: Saw cardiology last year, -Zio patch shows some episode of SVT. -Echo: EF 55 to 60%, mildly dilated aortic sinus.  Repeated TTE 02/2022: Similar findings -Coronary calcium score July 2023:"total coronary artery calcium score is 119, which places the patient in the 58th percentile" Was rec statins, declined . Hyperlipidemia: Patient is doing great with diet, even better than last year.  He remains active.  Also had to reduce his red  yeast rice intake to once daily due to side effects (LUTS). Plan: Check FLP. He will remain reluctant to take any cholesterol medication.  Advise he could be referred to the cholesterol clinic at [facility]. Aspirin: Given + coronary calcium score, a baby aspirin daily makes sense.  Recommend to think about it Asthma: Well-controlled, uses inhalers as needed RTC for blood work next week RTC 1 year

## 2022-09-19 ENCOUNTER — Encounter: Payer: Self-pay | Admitting: Internal Medicine

## 2022-09-19 NOTE — Assessment & Plan Note (Signed)
-  Td 2017 - s/p shingrex x2 - RSV d/w  - PNM 20: Recommended -  covid vax : 07-2022 thus utd - had a Flu shot    -CCS: (-) Cscope 2010  , Dr Erlene Quan, [place] 01/2021 negative.  Next 01-2024 -Prostate  cancer screening: Recent PSA normal, no symptoms currently. - labs: Reviewed, check FLP, UA urine culture (had mild LUTS few weeks ago). - Diet exercise: Doing great - POA: Discussed

## 2022-09-19 NOTE — Assessment & Plan Note (Signed)
Here for CPX CV: Saw cardiology last year, -Zio patch shows some episode of SVT. -Echo: EF 55 to 60%, mildly dilated aortic sinus.  Repeated TTE 02/2022: Similar findings -Coronary calcium score July 2023:"total coronary artery calcium score is 119, which places the patient in the 58th percentile" Was rec statins, declined . Hyperlipidemia: Patient is doing great with diet, even better than last year.  He remains active.  Also had to reduce his red yeast rice intake to once daily due to side effects (LUTS). Plan: Check FLP. He will remain reluctant to take any cholesterol medication.  Advise he could be referred to the cholesterol clinic at [facility]. Aspirin: Given + coronary calcium score, a baby aspirin daily makes sense.  Recommend to think about it Asthma: Well-controlled, uses inhalers as needed RTC for blood work next week RTC 1 year

## 2022-09-21 ENCOUNTER — Other Ambulatory Visit (INDEPENDENT_AMBULATORY_CARE_PROVIDER_SITE_OTHER): Payer: Medicare Other

## 2022-09-21 DIAGNOSIS — R399 Unspecified symptoms and signs involving the genitourinary system: Secondary | ICD-10-CM | POA: Diagnosis not present

## 2022-09-21 DIAGNOSIS — E785 Hyperlipidemia, unspecified: Secondary | ICD-10-CM | POA: Diagnosis not present

## 2022-09-21 LAB — URINALYSIS, ROUTINE W REFLEX MICROSCOPIC
Bilirubin Urine: NEGATIVE
Hgb urine dipstick: NEGATIVE
Ketones, ur: NEGATIVE
Leukocytes,Ua: NEGATIVE
Nitrite: NEGATIVE
RBC / HPF: NONE SEEN (ref 0–?)
Specific Gravity, Urine: 1.015 (ref 1.000–1.030)
Total Protein, Urine: NEGATIVE
Urine Glucose: NEGATIVE
Urobilinogen, UA: 0.2 (ref 0.0–1.0)
pH: 7.5 (ref 5.0–8.0)

## 2022-09-21 LAB — LIPID PANEL
Cholesterol: 245 mg/dL — ABNORMAL HIGH (ref 0–200)
HDL: 71.3 mg/dL (ref >39.00)
LDL Cholesterol: 157 mg/dL — ABNORMAL HIGH (ref 0–99)
NonHDL: 173.84
Total CHOL/HDL Ratio: 3
Triglycerides: 86 mg/dL (ref 0.0–149.0)
VLDL: 17.2 mg/dL (ref 0.0–40.0)

## 2022-09-22 LAB — URINE CULTURE
MICRO NUMBER:: 14401437
Result:: NO GROWTH
SPECIMEN QUALITY:: ADEQUATE

## 2022-09-25 ENCOUNTER — Encounter: Payer: Self-pay | Admitting: Internal Medicine

## 2022-10-22 DIAGNOSIS — K080 Exfoliation of teeth due to systemic causes: Secondary | ICD-10-CM | POA: Diagnosis not present

## 2022-11-04 DIAGNOSIS — K080 Exfoliation of teeth due to systemic causes: Secondary | ICD-10-CM | POA: Diagnosis not present

## 2023-01-08 DIAGNOSIS — K080 Exfoliation of teeth due to systemic causes: Secondary | ICD-10-CM | POA: Diagnosis not present

## 2023-01-27 DIAGNOSIS — H524 Presbyopia: Secondary | ICD-10-CM | POA: Diagnosis not present

## 2023-02-22 ENCOUNTER — Telehealth: Payer: Self-pay | Admitting: Internal Medicine

## 2023-02-22 NOTE — Telephone Encounter (Signed)
Copied from CRM [id number]. Topic: Medicare AWV >> Feb 22, 2023  9:37 AM Payton Doughty wrote: Reason for CRM: LM 02/22/2023 to schedule AWV   Verlee Rossetti; Care Guide Ambulatory Clinical Support [place] [facility] Direct Dial: [phone]

## 2023-03-05 ENCOUNTER — Ambulatory Visit (INDEPENDENT_AMBULATORY_CARE_PROVIDER_SITE_OTHER): Payer: Medicare Other | Admitting: *Deleted

## 2023-03-05 DIAGNOSIS — Z0000 Encounter for general adult medical examination without abnormal findings: Secondary | ICD-10-CM

## 2023-03-05 NOTE — Progress Notes (Signed)
Subjective:   Tommy Mcbride is a 67 y.o. male who presents for an Initial Medicare Annual Wellness Visit.  Visit Complete: Virtual  I connected with  Tommy Mcbride on 03/05/23 by a audio enabled telemedicine application and verified that I am speaking with the correct person using two identifiers.  Patient Location: Home  Provider Location: Office/Clinic  I discussed the limitations of evaluation and management by telemedicine. The patient expressed understanding and agreed to proceed.  Patient Medicare AWV questionnaire was completed by the patient on 02/26/23; I have confirmed that all information answered by patient is correct and no changes since this date.  Review of Systems     Cardiac Risk Factors include: advanced age (>75men, >35 women);male gender;dyslipidemia     Objective:    Today's Vitals   There is no height or weight on file to calculate BMI.     03/05/2023   11:02 AM  Advanced Directives  Does Patient Have a Medical Advance Directive? Yes  Type of Estate agent of [place];Living will  Does patient want to make changes to medical advance directive? No - Patient declined  Copy of Healthcare Power of Attorney in Chart? No - copy requested    Current Medications (verified) Outpatient Encounter Medications as of 03/05/2023  Medication Sig   aspirin EC (ADULT ASPIRIN REGIMEN) 81 MG tablet Take 81 mg by mouth every other day.   B COMPLEX-C PO Take by mouth.   budesonide-formoterol (SYMBICORT) 80-4.5 MCG/ACT inhaler Inhale 2 puffs into the lungs 3 (three) times daily as needed.   Cetirizine HCl ([facility] INDOOR/OUTDOOR ALLERGY RLF PO) Take by mouth.   Multiple Vitamin (MULTI-VITAMIN) tablet Take 1 tablet by mouth daily.   Omega-3 Fatty Acids (SEA-OMEGA 30 PO) Take by mouth. Algae   Red Yeast Rice Extract (RED YEAST RICE PO) Take by mouth. Once a day   triamcinolone cream (KENALOG) 0.1 % Apply 1 application topically 2 (two) times daily as  needed. (Patient not taking: Reported on 09/18/2022)   [DISCONTINUED] ibuprofen (ADVIL) 200 MG tablet Take 200 mg by mouth every 6 (six) hours as needed. (Patient not taking: Reported on 09/18/2022)   No facility-administered encounter medications on file as of 03/05/2023.    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    Past Surgical History:  Procedure Laterality Date   JOINT REPLACEMENT  08/2020   Knee replacement   REPLACEMENT TOTAL KNEE Left    ~08-2020   TONSILLECTOMY     Family History  Problem Relation Age of Onset   Stroke Father 40   Hyperlipidemia Father    Breast cancer Mother    Arthritis Mother    Cancer Mother    CAD Neg Hx    Diabetes Neg Hx    Colon cancer Neg Hx    Prostate cancer Neg Hx    Social History   Socioeconomic History   Marital status: Married    Spouse name: Not on file   Number of children: 2   Years of education: Not on file   Highest education level: Not on file  Occupational History   Occupation: tennis pro  Tobacco Use   Smoking status: Never   Smokeless tobacco: Never  Substance and Sexual Activity   Alcohol use: Yes  Alcohol/week: 4.0 standard drinks of alcohol    Types: 4 Cans of beer per week    Comment: few beers a week   Drug use: Never   Sexual activity: Yes    Birth control/protection: Condom  Other Topics Concern   Not on file  Social History Narrative   Tennis pro, very active.   2 daughters    Erie Noe  MD   Kyla Balzarine: grad school psychology    Social Determinants of Health   Financial Resource Strain: Low Risk  (02/26/2023)   Overall Financial Resource Strain (CARDIA)    Difficulty of Paying Living Expenses: Not hard at all  Food Insecurity: No Food Insecurity (02/26/2023)   Hunger Vital Sign    Worried About Running Out of Food in the Last  Year: Never true    Ran Out of Food in the Last Year: Never true  Transportation Needs: No Transportation Needs (02/26/2023)   PRAPARE - Administrator, Civil Service (Medical): No    Lack of Transportation (Non-Medical): No  Physical Activity: Sufficiently Active (02/26/2023)   Exercise Vital Sign    Days of Exercise per Week: 7 days    Minutes of Exercise per Session: 30 min  Stress: No Stress Concern Present (02/26/2023)   [employer] of Occupational Health - Occupational Stress Questionnaire    Feeling of Stress : Only a little  Social Connections: Unknown (02/26/2023)   Social Connection and Isolation Panel [NHANES]    Frequency of Communication with Friends and Family: Once a week    Frequency of Social Gatherings with Friends and Family: More than three times a week    Attends Religious Services: Not on Marketing executive or Organizations: Yes    Attends Engineer, structural: More than 4 times per year    Marital Status: Married    Tobacco Counseling Counseling given: Not Answered   Clinical Intake:  Pre-visit preparation completed: Yes  Pain : No/denies pain  Nutritional Risks: None Diabetes: No  How often do you need to have someone help you when you read instructions, pamphlets, or other written materials from your doctor or pharmacy?: 1 - Never  Interpreter Needed?: No  Information entered by :: [employer], CMA   Activities of Daily Living    02/26/2023   10:25 AM  In your present state of health, do you have any difficulty performing the following activities:  Hearing? 0  Vision? 0  Difficulty concentrating or making decisions? 0  Walking or climbing stairs? 0  Dressing or bathing? 0  Doing errands, shopping? 0  Preparing Food and eating ? N  Using the Toilet? N  In the past six months, have you accidently leaked urine? N  Do you have problems with loss of bowel control? N  Managing your Medications? N  Managing  your Finances? N  Housekeeping or managing your Housekeeping? N    Patient Care Team: Wanda Plump, MD as PCP - General (Internal Medicine) Rhetta Mura Danella Deis, MD as Referring Physician (Gastroenterology)  Indicate any recent Medical Services you may have received from other than [facility] providers in the past year (date may be approximate).     Assessment:   This is a routine wellness examination for Tommy Mcbride.  Hearing/Vision screen No results found.  Dietary issues and exercise activities discussed:     Goals Addressed   None    Depression Screen    03/05/2023   11:06 AM 09/18/2022  1:43 PM 07/25/2021    8:00 AM 07/18/2020    8:12 AM 07/13/2019    8:11 AM 07/05/2018    8:23 AM 06/28/2017    4:41 PM  PHQ 2/9 Scores  PHQ - 2 Score 0 0 0 0 0 0 0    Fall Risk    02/26/2023   10:25 AM 09/18/2022    1:43 PM 07/25/2021    8:01 AM 07/05/2018    8:23 AM 06/28/2017    4:41 PM  Fall Risk   Falls in the past year? 0 0 0 No No  Number falls in past yr: 0 0 0    Injury with Fall? 0 0 0    Risk for fall due to : No Fall Risks      Follow up Falls evaluation completed Falls evaluation completed Falls evaluation completed      MEDICARE RISK AT HOME:  Medicare Risk at Home - 03/05/23 1104     Any stairs in or around the home? Yes    If so, are there any without handrails? No    Home free of loose throw rugs in walkways, pet beds, electrical cords, etc? Yes    Adequate lighting in your home to reduce risk of falls? Yes    Life alert? No    Use of a cane, walker or w/c? No    Grab bars in the bathroom? No    Shower chair or bench in shower? No    Elevated toilet seat or a handicapped toilet? Yes   comfort height            TIMED UP AND GO:  Was the test performed? No    Cognitive Function:        03/05/2023   11:07 AM  6CIT Screen  What Year? 0 points  What month? 0 points  What time? 0 points  Count back from 20 0 points  Months in reverse 0 points  Repeat  phrase 0 points  Total Score 0 points    Immunizations Immunization History  Administered Date(s) Administered   Fluad Quad(high Dose 65+) 06/23/2022   Influenza,inj,Quad PF,6+ Mos 07/05/2018, 07/13/2019, 07/18/2020   Influenza-Unspecified 07/15/2021   PFIZER(Purple Top)SARS-COV-2 Vaccination 12/01/2019, 12/22/2019, 08/14/2020   Pfizer Covid-19 Vaccine Bivalent Booster 89yrs & up 07/23/2021   Tdap 11/26/2015   Zoster Recombinat (Shingrix) 07/13/2019, 10/13/2019    TDAP status: Up to date  Flu Vaccine status: Up to date  Pneumococcal vaccine status: Due, Education has been provided regarding the importance of this vaccine. Advised may receive this vaccine at local pharmacy or Health Dept. Aware to provide a copy of the vaccination record if obtained from local pharmacy or Health Dept. Verbalized acceptance and understanding.  Covid-19 vaccine status: Information provided on how to obtain vaccines.   Qualifies for Shingles Vaccine? Yes   Zostavax completed No   Shingrix Completed?: Yes  Screening Tests Health Maintenance  Topic Date Due   Medicare Annual Wellness (AWV)  Never done   COVID-19 Vaccine (5 - 2023-24 season) 05/15/2022   Pneumonia Vaccine 68+ Years old (1 of 1 - PCV) 09/19/2023 (Originally 03/02/2022)   INFLUENZA VACCINE  04/15/2023   Fecal DNA (Cologuard)  02/12/2024   DTaP/Tdap/Td (2 - Td or Tdap) 11/25/2025   Hepatitis C Screening  Completed   Zoster Vaccines- Shingrix  Completed   HPV VACCINES  Aged Out   Colonoscopy  Discontinued    Health Maintenance  Health Maintenance Due  Topic Date Due  Medicare Annual Wellness (AWV)  Never done   COVID-19 Vaccine (5 - 2023-24 season) 05/15/2022    Colorectal cancer screening: Type of screening: Cologuard. Completed 02/11/21. Repeat every 3 years  Lung Cancer Screening: (Low Dose CT Chest recommended if Age 2-80 years, 20 pack-year currently smoking OR have quit w/in 15years.) does not qualify.   Additional  Screening:  Hepatitis C Screening: does qualify; Completed 07/05/18  Vision Screening: Recommended annual ophthalmology exams for early detection of glaucoma and other disorders of the eye. Is the patient up to date with their annual eye exam?  Yes  Who is the provider or what is the name of the office in which the patient attends annual eye exams? [facility] If pt is not established with a provider, would they like to be referred to a provider to establish care? No .   Dental Screening: Recommended annual dental exams for proper oral hygiene  Diabetic Foot Exam: N/a  Community Resource Referral / Chronic Care Management: CRR required this visit?  No   CCM required this visit?  No    Plan:     I have personally reviewed and noted the following in the patient's chart:   Medical and social history Use of alcohol, tobacco or illicit drugs  Current medications and supplements including opioid prescriptions. Patient is not currently taking opioid prescriptions. Functional ability and status Nutritional status Physical activity Advanced directives List of other physicians Hospitalizations, surgeries, and ER visits in previous 12 months Vitals Screenings to include cognitive, depression, and falls Referrals and appointments  In addition, I have reviewed and discussed with patient certain preventive protocols, quality metrics, and best practice recommendations. A written personalized care plan for preventive services as well as general preventive health recommendations were provided to patient.     Donne Anon, CMA   03/05/2023   After Visit Summary: (MyChart) Due to this being a telephonic visit, the after visit summary with patients personalized plan was offered to patient via MyChart   Nurse Notes: None

## 2023-03-05 NOTE — Patient Instructions (Signed)
Tommy Mcbride , Thank you for taking time to come for your Medicare Wellness Visit. I appreciate your ongoing commitment to your health goals. Please review the following plan we discussed and let me know if I can assist you in the future.   These are the goals we discussed:  Goals   None     This is a list of the screening recommended for you and due dates:  Health Maintenance  Topic Date Due   COVID-19 Vaccine (5 - 2023-24 season) 05/15/2022   Pneumonia Vaccine (1 of 1 - PCV) 09/19/2023*   Flu Shot  04/15/2023   Cologuard (Stool DNA test)  02/12/2024   Medicare Annual Wellness Visit  03/04/2024   DTaP/Tdap/Td vaccine (2 - Td or Tdap) 11/25/2025   Hepatitis C Screening  Completed   Zoster (Shingles) Vaccine  Completed   HPV Vaccine  Aged Out   Colon Cancer Screening  Discontinued  *Topic was postponed. The date shown is not the original due date.     Next appointment: Follow up in one year for your annual wellness visit.   Preventive Care 62 Years and Older, Male Preventive care refers to lifestyle choices and visits with your health care provider that can promote health and wellness. What does preventive care include? A yearly physical exam. This is also called an annual well check. Dental exams once or twice a year. Routine eye exams. Ask your health care provider how often you should have your eyes checked. Personal lifestyle choices, including: Daily care of your teeth and gums. Regular physical activity. Eating a healthy diet. Avoiding tobacco and drug use. Limiting alcohol use. Practicing safe sex. Taking low doses of aspirin every day. Taking vitamin and mineral supplements as recommended by your health care provider. What happens during an annual well check? The services and screenings done by your health care provider during your annual well check will depend on your age, overall health, lifestyle risk factors, and family history of disease. Counseling  Your  health care provider may ask you questions about your: Alcohol use. Tobacco use. Drug use. Emotional well-being. Home and relationship well-being. Sexual activity. Eating habits. History of falls. Memory and ability to understand (cognition). Work and work Astronomer. Screening  You may have the following tests or measurements: Height, weight, and BMI. Blood pressure. Lipid and cholesterol levels. These may be checked every 5 years, or more frequently if you are over 11 years old. Skin check. Lung cancer screening. You may have this screening every year starting at age 39 if you have a 30-pack-year history of smoking and currently smoke or have quit within the past 15 years. Fecal occult blood test (FOBT) of the stool. You may have this test every year starting at age 86. Flexible sigmoidoscopy or colonoscopy. You may have a sigmoidoscopy every 5 years or a colonoscopy every 10 years starting at age 88. Prostate cancer screening. Recommendations will vary depending on your family history and other risks. Hepatitis C blood test. Hepatitis B blood test. Sexually transmitted disease (STD) testing. Diabetes screening. This is done by checking your blood sugar (glucose) after you have not eaten for a while (fasting). You may have this done every 1-3 years. Abdominal aortic aneurysm (AAA) screening. You may need this if you are a current or former smoker. Osteoporosis. You may be screened starting at age 11 if you are at high risk. Talk with your health care provider about your test results, treatment options, and if necessary, the need  for more tests. Vaccines  Your health care provider may recommend certain vaccines, such as: Influenza vaccine. This is recommended every year. Tetanus, diphtheria, and acellular pertussis (Tdap, Td) vaccine. You may need a Td booster every 10 years. Zoster vaccine. You may need this after age 50. Pneumococcal 13-valent conjugate (PCV13) vaccine. One dose  is recommended after age 84. Pneumococcal polysaccharide (PPSV23) vaccine. One dose is recommended after age 102. Talk to your health care provider about which screenings and vaccines you need and how often you need them. This information is not intended to replace advice given to you by your health care provider. Make sure you discuss any questions you have with your health care provider. Document Released: 09/27/2015 Document Revised: 05/20/2016 Document Reviewed: 07/02/2015 Elsevier Interactive Patient Education  2017 [employer].  Fall Prevention in the Home Falls can cause injuries. They can happen to people of all ages. There are many things you can do to make your home safe and to help prevent falls. What can I do on the outside of my home? Regularly fix the edges of walkways and driveways and fix any cracks. Remove anything that might make you trip as you walk through a door, such as a raised step or threshold. Trim any bushes or trees on the path to your home. Use bright outdoor lighting. Clear any walking paths of anything that might make someone trip, such as rocks or tools. Regularly check to see if handrails are loose or broken. Make sure that both sides of any steps have handrails. Any raised decks and porches should have guardrails on the edges. Have any leaves, snow, or ice cleared regularly. Use sand or salt on walking paths during winter. Clean up any spills in your garage right away. This includes oil or grease spills. What can I do in the bathroom? Use night lights. Install grab bars by the toilet and in the tub and shower. Do not use towel bars as grab bars. Use non-skid mats or decals in the tub or shower. If you need to sit down in the shower, use a plastic, non-slip stool. Keep the floor dry. Clean up any water that spills on the floor as soon as it happens. Remove soap buildup in the tub or shower regularly. Attach bath mats securely with double-sided non-slip rug  tape. Do not have throw rugs and other things on the floor that can make you trip. What can I do in the bedroom? Use night lights. Make sure that you have a light by your bed that is easy to reach. Do not use any sheets or blankets that are too big for your bed. They should not hang down onto the floor. Have a firm chair that has side arms. You can use this for support while you get dressed. Do not have throw rugs and other things on the floor that can make you trip. What can I do in the kitchen? Clean up any spills right away. Avoid walking on wet floors. Keep items that you use a lot in easy-to-reach places. If you need to reach something above you, use a strong step stool that has a grab bar. Keep electrical cords out of the way. Do not use floor polish or wax that makes floors slippery. If you must use wax, use non-skid floor wax. Do not have throw rugs and other things on the floor that can make you trip. What can I do with my stairs? Do not leave any items on the stairs.  Make sure that there are handrails on both sides of the stairs and use them. Fix handrails that are broken or loose. Make sure that handrails are as long as the stairways. Check any carpeting to make sure that it is firmly attached to the stairs. Fix any carpet that is loose or worn. Avoid having throw rugs at the top or bottom of the stairs. If you do have throw rugs, attach them to the floor with carpet tape. Make sure that you have a light switch at the top of the stairs and the bottom of the stairs. If you do not have them, ask someone to add them for you. What else can I do to help prevent falls? Wear shoes that: Do not have high heels. Have rubber bottoms. Are comfortable and fit you well. Are closed at the toe. Do not wear sandals. If you use a stepladder: Make sure that it is fully opened. Do not climb a closed stepladder. Make sure that both sides of the stepladder are locked into place. Ask someone to  hold it for you, if possible. Clearly mark and make sure that you can see: Any grab bars or handrails. First and last steps. Where the edge of each step is. Use tools that help you move around (mobility aids) if they are needed. These include: Canes. Walkers. Scooters. Crutches. Turn on the lights when you go into a dark area. Replace any light bulbs as soon as they burn out. Set up your furniture so you have a clear path. Avoid moving your furniture around. If any of your floors are uneven, fix them. If there are any pets around you, be aware of where they are. Review your medicines with your doctor. Some medicines can make you feel dizzy. This can increase your chance of falling. Ask your doctor what other things that you can do to help prevent falls. This information is not intended to replace advice given to you by your health care provider. Make sure you discuss any questions you have with your health care provider. Document Released: 06/27/2009 Document Revised: 02/06/2016 Document Reviewed: 10/05/2014 Elsevier Interactive Patient Education  2017 [employer].

## 2023-04-01 DIAGNOSIS — D1801 Hemangioma of skin and subcutaneous tissue: Secondary | ICD-10-CM | POA: Diagnosis not present

## 2023-04-01 DIAGNOSIS — L814 Other melanin hyperpigmentation: Secondary | ICD-10-CM | POA: Diagnosis not present

## 2023-04-01 DIAGNOSIS — L821 Other seborrheic keratosis: Secondary | ICD-10-CM | POA: Diagnosis not present

## 2023-04-01 DIAGNOSIS — L578 Other skin changes due to chronic exposure to nonionizing radiation: Secondary | ICD-10-CM | POA: Diagnosis not present

## 2023-07-08 DIAGNOSIS — K080 Exfoliation of teeth due to systemic causes: Secondary | ICD-10-CM | POA: Diagnosis not present

## 2023-07-22 ENCOUNTER — Encounter: Payer: Self-pay | Admitting: Internal Medicine

## 2023-07-22 MED ORDER — BUDESONIDE-FORMOTEROL FUMARATE 80-4.5 MCG/ACT IN AERO: 2.0000 | INHALATION_SPRAY | Freq: Three times a day (TID) | RESPIRATORY_TRACT | 5 refills | Status: DC | PRN

## 2023-09-24 ENCOUNTER — Encounter: Payer: Self-pay | Admitting: Internal Medicine

## 2023-09-24 ENCOUNTER — Ambulatory Visit (INDEPENDENT_AMBULATORY_CARE_PROVIDER_SITE_OTHER): Payer: Medicare Other | Admitting: Internal Medicine

## 2023-09-24 ENCOUNTER — Encounter: Payer: BLUE CROSS/BLUE SHIELD | Admitting: Internal Medicine

## 2023-09-24 VITALS — BP 116/78 | HR 77 | Temp 97.8°F | Resp 16 | Ht 72.0 in | Wt 179.2 lb

## 2023-09-24 DIAGNOSIS — Z0000 Encounter for general adult medical examination without abnormal findings: Secondary | ICD-10-CM | POA: Diagnosis not present

## 2023-09-24 DIAGNOSIS — R399 Unspecified symptoms and signs involving the genitourinary system: Secondary | ICD-10-CM

## 2023-09-24 DIAGNOSIS — R3989 Other symptoms and signs involving the genitourinary system: Secondary | ICD-10-CM

## 2023-09-24 DIAGNOSIS — E785 Hyperlipidemia, unspecified: Secondary | ICD-10-CM

## 2023-09-24 DIAGNOSIS — M79645 Pain in left finger(s): Secondary | ICD-10-CM

## 2023-09-24 DIAGNOSIS — Z1211 Encounter for screening for malignant neoplasm of colon: Secondary | ICD-10-CM

## 2023-09-24 DIAGNOSIS — Z0001 Encounter for general adult medical examination with abnormal findings: Secondary | ICD-10-CM

## 2023-09-24 LAB — CBC WITH DIFFERENTIAL/PLATELET
Basophils Absolute: 0 10*3/uL (ref 0.0–0.1)
Basophils Relative: 0.6 % (ref 0.0–3.0)
Eosinophils Absolute: 0.1 10*3/uL (ref 0.0–0.7)
Eosinophils Relative: 2.1 % (ref 0.0–5.0)
HCT: 46.8 % (ref 39.0–52.0)
Hemoglobin: 15.6 g/dL (ref 13.0–17.0)
Lymphocytes Relative: 27.5 % (ref 12.0–46.0)
Lymphs Abs: 1.6 10*3/uL (ref 0.7–4.0)
MCHC: 33.4 g/dL (ref 30.0–36.0)
MCV: 92 fL (ref 78.0–100.0)
Monocytes Absolute: 0.6 10*3/uL (ref 0.1–1.0)
Monocytes Relative: 10.7 % (ref 3.0–12.0)
Neutro Abs: 3.4 10*3/uL (ref 1.4–7.7)
Neutrophils Relative %: 59.1 % (ref 43.0–77.0)
Platelets: 281 10*3/uL (ref 150.0–400.0)
RBC: 5.09 Mil/uL (ref 4.22–5.81)
RDW: 13.5 % (ref 11.5–15.5)
WBC: 5.8 10*3/uL (ref 4.0–10.5)

## 2023-09-24 LAB — COMPREHENSIVE METABOLIC PANEL
ALT: 26 U/L (ref 0–53)
AST: 26 U/L (ref 0–37)
Albumin: 4.6 g/dL (ref 3.5–5.2)
Alkaline Phosphatase: 80 U/L (ref 39–117)
BUN: 21 mg/dL (ref 6–23)
CO2: 28 meq/L (ref 19–32)
Calcium: 9.2 mg/dL (ref 8.4–10.5)
Chloride: 101 meq/L (ref 96–112)
Creatinine, Ser: 1.01 mg/dL (ref 0.40–1.50)
GFR: 77.47 mL/min (ref >60.00)
Glucose, Bld: 86 mg/dL (ref 70–99)
Potassium: 4 meq/L (ref 3.5–5.1)
Sodium: 137 meq/L (ref 135–145)
Total Bilirubin: 1.2 mg/dL (ref 0.2–1.2)
Total Protein: 7 g/dL (ref 6.0–8.3)

## 2023-09-24 LAB — LIPID PANEL
Cholesterol: 249 mg/dL — ABNORMAL HIGH (ref 0–200)
HDL: 74.7 mg/dL (ref >39.00)
LDL Cholesterol: 162 mg/dL — ABNORMAL HIGH (ref 0–99)
NonHDL: 174.28
Total CHOL/HDL Ratio: 3
Triglycerides: 60 mg/dL (ref 0.0–149.0)
VLDL: 12 mg/dL (ref 0.0–40.0)

## 2023-09-24 LAB — URINALYSIS, ROUTINE W REFLEX MICROSCOPIC
Bilirubin Urine: NEGATIVE
Hgb urine dipstick: NEGATIVE
Ketones, ur: NEGATIVE
Leukocytes,Ua: NEGATIVE
Nitrite: NEGATIVE
Specific Gravity, Urine: 1.02 (ref 1.000–1.030)
Total Protein, Urine: NEGATIVE
Urine Glucose: NEGATIVE
Urobilinogen, UA: 0.2 (ref 0.0–1.0)
pH: 6 (ref 5.0–8.0)

## 2023-09-24 LAB — PSA: PSA: 3.25 ng/mL (ref 0.10–4.00)

## 2023-09-24 NOTE — Patient Instructions (Addendum)
 Vaccines I recommend: Covid booster PNM 20   GO TO THE LAB : Get the blood work     Next visit with me  1 year   Please schedule it at the front desk

## 2023-09-24 NOTE — Progress Notes (Signed)
 Subjective:    Patient ID: Tommy Mcbride, male    DOB: 1956-07-14, 68 y.o.   MRN: [id number]  DOS:  09/24/2023 Type of visit - description: CPX  Here for CPX Feeling well, exercise regularly, denies chest pain, difficulty breathing or palpitations. Has some weakness and pain around the left thumb.  No swelling.  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  Previously had nocturia times 1 at night, here it has increased to twice at night.  During the nighttime his urinary stream is slightly slow. Denies any other LUTS.  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     Past Surgical History:  Procedure Laterality Date   JOINT REPLACEMENT  08/2020   Knee replacement   REPLACEMENT TOTAL KNEE Left    ~08-2020   TONSILLECTOMY     Social History   Socioeconomic History   Marital status: Married    Spouse name: Not on file   Number of children: 2   Years of education: Not on file   Highest education level: Not on file  Occupational History   Occupation: tennis pro  Tobacco Use   Smoking status: Never   Smokeless tobacco: Never  Substance and Sexual Activity   Alcohol use: Yes    Alcohol/week: 4.0 standard drinks of alcohol    Types: 4 Cans of beer per week    Comment: few beers a week   Drug use: Never   Sexual activity: Yes    Birth control/protection: Condom  Other Topics Concern   Not on file  Social History Narrative   Tennis pro, very active.   2 daughters    Shanda  MD   Kathern: grad school psychology    Social Drivers of Health   Financial Resource Strain: Low Risk  (02/26/2023)   Overall Financial Resource Strain (CARDIA)    Difficulty of Paying Living Expenses: Not hard at all  Food Insecurity: No Food Insecurity (02/26/2023)    Hunger Vital Sign    Worried About Running Out of Food in the Last Year: Never true    Ran Out of Food in the Last Year: Never true  Transportation Needs: No Transportation Needs (02/26/2023)   PRAPARE - Administrator, Civil Service (Medical): No    Lack of Transportation (Non-Medical): No  Physical Activity: Sufficiently Active (02/26/2023)   Exercise Vital Sign    Days of Exercise per Week: 7 days    Minutes of Exercise per Session: 30 min  Stress: No Stress Concern Present (02/26/2023)   [employer] of Occupational Health - Occupational Stress Questionnaire    Feeling of Stress : Only a little  Social Connections: Unknown (02/26/2023)   Social Connection and Isolation Panel [NHANES]    Frequency of Communication with Friends and Family: Once a week    Frequency of Social Gatherings with Friends and Family: More than three times a week    Attends Religious Services: Not on file    Active Member of Clubs or Organizations: Yes    Attends Banker Meetings: More than 4 times per year    Marital Status: Married  Catering Manager Violence: Not At Risk (03/05/2023)   Humiliation, Afraid,  Rape, and Kick questionnaire    Fear of Current or Ex-Partner: No    Emotionally Abused: No    Physically Abused: No    Sexually Abused: No    Current Outpatient Medications  Medication Instructions   B COMPLEX-C PO Take by mouth.   budesonide -formoterol  (SYMBICORT ) 80-4.5 MCG/ACT inhaler 2 puffs, Inhalation, 3 times daily PRN   Cetirizine HCl ([employer] INDOOR/OUTDOOR ALLERGY RLF PO) Take by mouth.   Multiple Vitamin (MULTI-VITAMIN) tablet 1 tablet, Daily   Omega-3 Fatty Acids (SEA-OMEGA 30 PO) Take by mouth. Algae   Red Yeast Rice Extract (RED YEAST RICE PO) Take by mouth. Once a day       Objective:   Physical Exam BP 116/78   Pulse 77   Temp 97.8 F (36.6 C) (Oral)   Resp 16   Ht 6' (1.829 m)   Wt 179 lb 4 oz (81.3 kg)   SpO2 96%   BMI 24.31 kg/m  General: Well  developed, NAD, BMI noted Neck: No  thyromegaly  HEENT:  Normocephalic . Face symmetric, atraumatic Lungs:  CTA B Normal respiratory effort, no intercostal retractions, no accessory muscle use. Heart: RRR,  no murmur.  Abdomen:  Not distended, soft, non-tender. No rebound or rigidity.   Lower extremities: no pretibial edema bilaterally DRE: Normal sphincter tone, no stools, at the left lobule I feel a area of induration but no tenderness approximately 1/2 to 3/4 cm Hands and wrist: Bilateral evidence of DJD without synovitis. The left thumb is indeed slightly weaker, the left thenar area since is slightly hypertrophic Skin: Exposed areas without rash. Not pale. Not jaundice Neurologic:  alert & oriented X3.  Speech normal, gait appropriate for age and unassisted Strength symmetric and appropriate for age.  Psych: Cognition and judgment appear intact.  Cooperative with normal attention span and concentration.  Behavior appropriate. No anxious or depressed appearing.     Assessment    Assessment Hyperlipidemia--will be reluctant to take statins/zetia Asthma DJD  Dermatitis:Uses topical steroids as needed scrotal pain, 2014, saw urology, DX spermatocele, now asymptomatic CV:Saw cardiology 2023, -Zio patch shows some episode of SVT. -Echo: EF 55 to 60%, mildly dilated aortic sinus.  Repeated TTE 02/2022: Similar findings -Coronary calcium score July 2023:total coronary artery calcium score is 119, which places the patient in the 58th percentile   PLAN Here for CPX -Td 2017 - s/p shingrex x2 -- had a Flu shot    -  Rec PNM 20, covid booster  -CCS: (-) Cscope 2010  , Dr Shana, Cologuard 01/2021 negative.  We talk about a colonoscopy but he elected to do a Cologuard in May 2025. -Prostate  cancer screening: See comments under LUTS - labs:UA urine culture PSA CMP FLP CBC - Diet exercise: Very active, has a healthy lifestyle Hyperlipidemia: See previous entries, he remains  reluctant to take his statins.  Check FLP. Cardiovascular: Saw cardiology 2023, coronary calcium score was 119.  Currently asymptomatic.  He is considering see them again Aspirin: Took it temporarily, bled easily, self stopped it. LUTS: Mostly nocturia, DRE showed induration on the left prostate gland. Check UA urine culture PSA.  Referred to urology Thumb pain, weakness: Denies neck pain, no radiculopathy type of symptoms, he is concerned about the issue, referred to Ortho hand. Asthma: Well-controlled with Symbicort  as needed RTC 1 year

## 2023-09-25 LAB — URINE CULTURE
MICRO NUMBER:: 15943308
Result:: NO GROWTH
SPECIMEN QUALITY:: ADEQUATE

## 2023-09-26 ENCOUNTER — Encounter: Payer: Self-pay | Admitting: Internal Medicine

## 2023-09-26 NOTE — Assessment & Plan Note (Signed)
 Here for CPX  Hyperlipidemia: See previous entries, he remains reluctant to take his statins.  Check FLP. Cardiovascular: Saw cardiology 2023, coronary calcium score was 119.  Currently asymptomatic.  He is considering see them again Aspirin: Took it temporarily, bled easily, self stopped it. LUTS: Mostly nocturia, DRE showed induration on the left prostate gland. Check UA urine culture PSA.  Referred to urology Thumb pain, weakness: Denies neck pain, no radiculopathy type of symptoms, he is concerned about the issue, referred to Ortho hand. Asthma: Well-controlled with Symbicort  as needed RTC 1 year

## 2023-09-26 NOTE — Assessment & Plan Note (Signed)
 Here for CPX -Td 2017 - s/p shingrex x2 -- had a Flu shot    -  Rec PNM 20, covid booster  -CCS: (-) Cscope 2010  , Dr Shana, Cologuard 01/2021 negative.  We talk about a colonoscopy but he elected to do a Cologuard in May 2025. -Prostate  cancer screening: See comments under LUTS - labs:UA urine culture PSA CMP FLP CBC - Diet exercise: Very active, has a healthy lifestyle

## 2023-10-15 ENCOUNTER — Encounter: Payer: Self-pay | Admitting: Urology

## 2023-10-15 ENCOUNTER — Ambulatory Visit: Payer: Medicare Other | Admitting: Urology

## 2023-10-15 VITALS — BP 143/91 | HR 93 | Ht 72.0 in | Wt 184.0 lb

## 2023-10-15 DIAGNOSIS — R972 Elevated prostate specific antigen [PSA]: Secondary | ICD-10-CM

## 2023-10-15 DIAGNOSIS — N138 Other obstructive and reflux uropathy: Secondary | ICD-10-CM | POA: Diagnosis not present

## 2023-10-15 DIAGNOSIS — N401 Enlarged prostate with lower urinary tract symptoms: Secondary | ICD-10-CM | POA: Insufficient documentation

## 2023-10-15 LAB — URINALYSIS
Bilirubin, UA: NEGATIVE
Glucose, UA: NEGATIVE
Ketones, UA: NEGATIVE
Leukocytes,UA: NEGATIVE
Protein,UA: NEGATIVE
RBC, UA: NEGATIVE
Specific Gravity, UA: 1.015 (ref 1.005–1.030)
Urobilinogen, Ur: 0.2 mg/dL (ref 0.2–1.0)
pH, UA: 7 (ref 5.0–7.5)

## 2023-10-15 NOTE — Progress Notes (Signed)
Assessment: 1. Rising PSA level   2. BPH with obstruction/lower urinary tract symptoms     Plan: I personally reviewed the patient's chart including provider notes and lab results. I discussed the potential significance of a rising PSA with the patient and his daughter today.  He did have a slight increase in his urinary symptoms as well as some increased discomfort with a prostate exam about 3 weeks ago.  This was at the same time that the PSA had increased. I think it be reasonable to recheck a PSA in approximately 1 month. I will contact him with results and recommendations at that time.  Chief Complaint:  Chief Complaint  Patient presents with   Benign Prostatic Hypertrophy    History of Present Illness:  Tommy Mcbride is a 68 y.o. male who is seen in consultation from Wanda Plump, MD for evaluation of abnormal prostate exam and lower urinary tract symptoms. He was recently found to have an abnormal DRE with possible induration on the left side of the prostate.  PSA results: 3/17 0.87 10/19 1.48 11/21 0.92 11/22 1.58 10/23 1.81 1/25 3.25   He has some baseline lower urinary tract symptoms including decreased stream and nocturia x 1.  He recently noted an increase in his nocturia to 2 times per night.  This is since returned to 1 time per night.  No dysuria or gross hematuria.  No history of UTIs or prostatitis.  No family history of prostate cancer. IPSS = 4 QOL = 2 today.  He is a Conservation officer, nature at [employer].  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     Past Surgical History:  Past Surgical History:  Procedure Laterality Date   JOINT REPLACEMENT  08/2020   Knee replacement   REPLACEMENT TOTAL KNEE Left    ~08-2020   TONSILLECTOMY      Allergies:  Allergies  Allergen Reactions   Penicillins Other (See  Comments)    Unknown reaction     Family History:  Family History  Problem Relation Age of Onset   Stroke Father 62   Hyperlipidemia Father    Breast cancer Mother    Arthritis Mother    Cancer Mother    CAD Neg Hx    Diabetes Neg Hx    Colon cancer Neg Hx    Prostate cancer Neg Hx     Social History:  Social History   Tobacco Use   Smoking status: Never   Smokeless tobacco: Never  Substance Use Topics   Alcohol use: Yes    Alcohol/week: 4.0 standard drinks of alcohol    Types: 4 Cans of beer per week    Comment: few beers a week   Drug use: Never    Review of symptoms:  Constitutional:  Negative for unexplained weight loss, night sweats, fever, chills ENT:  Negative for nose bleeds, sinus pain, painful swallowing CV:  Negative for chest pain, shortness of breath, exercise intolerance, palpitations, loss of consciousness Resp:  Negative for cough, wheezing, shortness of breath GI:  Negative for nausea, vomiting, diarrhea, bloody stools GU:  Positives noted in HPI; otherwise negative for gross hematuria, dysuria, urinary incontinence Neuro:  Negative for seizures, poor balance, limb weakness, slurred speech Psych:  Negative for lack of energy, depression, anxiety Endocrine:  Negative for polydipsia, polyuria, symptoms of hypoglycemia (dizziness, hunger, sweating) Hematologic:  Negative for anemia, purpura, petechia, prolonged or excessive bleeding, use of anticoagulants  Allergic:  Negative for difficulty breathing or choking as a result of exposure to anything; no shellfish allergy; no allergic response (rash/itch) to materials, foods  Physical exam: BP (!) 143/91   Pulse 93   Ht 6' (1.829 m)   Wt 184 lb (83.5 kg)   BMI 24.95 kg/m  GENERAL APPEARANCE:  Well appearing, well developed, well nourished, NAD HEENT: Atraumatic, Normocephalic, oropharynx clear. NECK: Supple without lymphadenopathy or thyromegaly. LUNGS: Clear to auscultation bilaterally. HEART:  Regular Rate and Rhythm without murmurs, gallops, or rubs. ABDOMEN: Soft, non-tender, No Masses. EXTREMITIES: Moves all extremities well.  Without clubbing, cyanosis, or edema. NEUROLOGIC:  Alert and oriented x 3, normal gait, CN II-XII grossly intact.  MENTAL STATUS:  Appropriate. BACK:  Non-tender to palpation.  No CVAT SKIN:  Warm, dry and intact.   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

## 2023-11-04 DIAGNOSIS — M1812 Unilateral primary osteoarthritis of first carpometacarpal joint, left hand: Secondary | ICD-10-CM | POA: Diagnosis not present

## 2023-11-12 ENCOUNTER — Other Ambulatory Visit: Payer: Medicare Other

## 2023-11-12 DIAGNOSIS — N138 Other obstructive and reflux uropathy: Secondary | ICD-10-CM

## 2023-11-12 DIAGNOSIS — R972 Elevated prostate specific antigen [PSA]: Secondary | ICD-10-CM | POA: Diagnosis not present

## 2023-11-12 DIAGNOSIS — N401 Enlarged prostate with lower urinary tract symptoms: Secondary | ICD-10-CM | POA: Diagnosis not present

## 2023-11-13 LAB — PSA: Prostate Specific Ag, Serum: 4 ng/mL (ref 0.0–4.0)

## 2023-12-24 ENCOUNTER — Ambulatory Visit
Admission: RE | Admit: 2023-12-24 | Discharge: 2023-12-24 | Disposition: A | Discharge status: Home / Self Care | Source: Ambulatory Visit | Attending: Urology | Admitting: Urology

## 2023-12-24 DIAGNOSIS — R972 Elevated prostate specific antigen [PSA]: Secondary | ICD-10-CM

## 2023-12-24 MED ORDER — GADOPICLENOL 0.5 MMOL/ML IV SOLN
8.0000 mL | Freq: Once | INTRAVENOUS | Status: AC | PRN
  Administered 2023-12-24: 8 mL via INTRAVENOUS

## 2024-01-06 ENCOUNTER — Encounter: Payer: Self-pay | Admitting: Urology

## 2024-01-07 ENCOUNTER — Other Ambulatory Visit: Payer: Self-pay | Admitting: Urology

## 2024-01-07 DIAGNOSIS — R972 Elevated prostate specific antigen [PSA]: Secondary | ICD-10-CM

## 2024-01-07 DIAGNOSIS — K080 Exfoliation of teeth due to systemic causes: Secondary | ICD-10-CM | POA: Diagnosis not present

## 2024-01-20 DIAGNOSIS — C61 Malignant neoplasm of prostate: Secondary | ICD-10-CM | POA: Diagnosis not present

## 2024-01-31 ENCOUNTER — Other Ambulatory Visit (HOSPITAL_COMMUNITY): Payer: Self-pay | Admitting: Urology

## 2024-01-31 DIAGNOSIS — C61 Malignant neoplasm of prostate: Secondary | ICD-10-CM

## 2024-02-01 DIAGNOSIS — R972 Elevated prostate specific antigen [PSA]: Secondary | ICD-10-CM | POA: Diagnosis not present

## 2024-02-08 ENCOUNTER — Encounter (HOSPITAL_COMMUNITY)
Admission: RE | Admit: 2024-02-08 | Discharge: 2024-02-08 | Disposition: A | Discharge status: Home / Self Care | Source: Ambulatory Visit | Attending: Urology | Admitting: Urology

## 2024-02-08 DIAGNOSIS — C61 Malignant neoplasm of prostate: Secondary | ICD-10-CM | POA: Insufficient documentation

## 2024-02-08 MED ORDER — FLOTUFOLASTAT F 18 GALLIUM 296-5846 MBQ/ML IV SOLN
8.5100 | Freq: Once | INTRAVENOUS | Status: AC
Start: 2024-02-08 — End: 2024-02-08
  Administered 2024-02-08: 8.51 via INTRAVENOUS

## 2024-02-29 ENCOUNTER — Other Ambulatory Visit: Payer: Self-pay | Admitting: Urology

## 2024-02-29 DIAGNOSIS — C61 Malignant neoplasm of prostate: Secondary | ICD-10-CM | POA: Diagnosis not present

## 2024-03-01 ENCOUNTER — Other Ambulatory Visit: Payer: Self-pay | Admitting: Urology

## 2024-03-03 DIAGNOSIS — Z1211 Encounter for screening for malignant neoplasm of colon: Secondary | ICD-10-CM | POA: Diagnosis not present

## 2024-03-06 DIAGNOSIS — C61 Malignant neoplasm of prostate: Secondary | ICD-10-CM | POA: Diagnosis not present

## 2024-03-06 DIAGNOSIS — M6281 Muscle weakness (generalized): Secondary | ICD-10-CM | POA: Diagnosis not present

## 2024-03-07 LAB — COLOGUARD: COLOGUARD: NEGATIVE

## 2024-03-08 ENCOUNTER — Ambulatory Visit: Payer: Self-pay | Admitting: Internal Medicine

## 2024-03-09 NOTE — Progress Notes (Signed)
 [facility] Quality Team Note  Name: MARTYN TIMME Date of Birth: [birth year] MRN: [id number] Date: 03/09/2024  [facility] Quality Team has reviewed this patient's chart, please see recommendations below:  [facility] Quality Other; (CHART REVIEW FOR COLORECTAL CANCER SCREENING, ABSTRACTED NEGATIVE COLOGUARD)

## 2024-03-10 ENCOUNTER — Ambulatory Visit (INDEPENDENT_AMBULATORY_CARE_PROVIDER_SITE_OTHER): Payer: Medicare Other | Admitting: *Deleted

## 2024-03-10 VITALS — Ht 72.0 in | Wt 183.0 lb

## 2024-03-10 DIAGNOSIS — Z0000 Encounter for general adult medical examination without abnormal findings: Secondary | ICD-10-CM

## 2024-03-10 NOTE — Progress Notes (Signed)
 Subjective:   Tommy Mcbride is a 68 y.o. who presents for a Medicare Wellness preventive visit.  As a reminder, Annual Wellness Visits don't include a physical exam, and some assessments may be limited, especially if this visit is performed virtually. We may recommend an in-person follow-up visit with your provider if needed.  Visit Complete: Virtual I connected with  Elspeth LITTIE Silliman on 03/10/24 by a audio enabled telemedicine application and verified that I am speaking with the correct person using two identifiers.  Patient Location: Home  Provider Location: Office/Clinic  I discussed the limitations of evaluation and management by telemedicine. The patient expressed understanding and agreed to proceed.  Vital Signs: Because this visit was a virtual/telehealth visit, some criteria may be missing or patient reported. Any vitals not documented were not able to be obtained and vitals that have been documented are patient reported.  VideoDeclined- This patient declined Librarian, academic. Therefore the visit was completed with audio only.  Persons Participating in Visit: Patient.  AWV Questionnaire: Yes: Patient Medicare AWV questionnaire was completed by the patient on 03/03/24; I have confirmed that all information answered by patient is correct and no changes since this date.  Cardiac Risk Factors include: advanced age (>76men, >36 women);dyslipidemia;Other (see comment), Risk factor comments: SVT     Objective:    Today's Vitals   03/10/24 1054  Weight: 183 lb (83 kg)  Height: 6' (1.829 m)   Body mass index is 24.82 kg/m.     03/10/2024   11:15 AM 03/05/2023   11:02 AM  Advanced Directives  Does Patient Have a Medical Advance Directive? Yes Yes  Type of Estate agent of [place];Living will Healthcare Power of [place];Living will  Does patient want to make changes to medical advance directive? No - Patient declined No -  Patient declined  Copy of Healthcare Power of Attorney in Chart? No - copy requested No - copy requested    Current Medications (verified) Outpatient Encounter Medications as of 03/10/2024  Medication Sig   B COMPLEX-C PO Take by mouth. (Patient taking differently: Take 1 tablet by mouth daily as needed. Only takes during the winter)   budesonide -formoterol  (SYMBICORT ) 80-4.5 MCG/ACT inhaler Inhale 2 puffs into the lungs 3 (three) times daily as needed.   Cetirizine HCl ([employer]S INDOOR/OUTDOOR ALLERGY RLF PO) Take by mouth.   Multiple Vitamin (MULTI-VITAMIN) tablet Take 1 tablet by mouth daily.   Omega-3 Fatty Acids (SEA-OMEGA 30 PO) Take by mouth. Algae (Patient taking differently: Take 2 capsules by mouth in the morning and at bedtime. Algae)   Red Yeast Rice Extract (RED YEAST RICE PO) Take by mouth. Once a day (Patient taking differently: Take 1 capsule by mouth in the morning and at bedtime. Once a day)   TART CHERRY PO Take 1 tablet by mouth daily.   No facility-administered encounter medications on file as of 03/10/2024.    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    Prostate cancer [facility])    Past Surgical History:  Procedure Laterality Date   JOINT REPLACEMENT  08/2020   Knee replacement   REPLACEMENT TOTAL KNEE Left    ~08-2020   TONSILLECTOMY     Family History  Problem Relation Age of Onset   Stroke Father  75   Hyperlipidemia Father    Breast cancer Mother    Arthritis Mother    Cancer Mother    CAD Neg Hx    Diabetes Neg Hx    Colon cancer Neg Hx    Prostate cancer Neg Hx    Social History   Socioeconomic History   Marital status: Married    Spouse name: Not on file   Number of children: 2   Years of education: Not on file   Highest education level: Bachelor's degree (e.g., BA, AB, BS)   Occupational History   Occupation: tennis pro  Tobacco Use   Smoking status: Never   Smokeless tobacco: Never  Substance and Sexual Activity   Alcohol use: Yes    Alcohol/week: 4.0 standard drinks of alcohol    Types: 4 Cans of beer per week    Comment: few beers a week   Drug use: Never   Sexual activity: Yes    Birth control/protection: Condom  Other Topics Concern   Not on file  Social History Narrative   Tennis pro, very active.   2 daughters    Shanda  MD   Kathern: grad school psychology    Social Drivers of Health   Financial Resource Strain: Low Risk  (03/10/2024)   Overall Financial Resource Strain (CARDIA)    Difficulty of Paying Living Expenses: Not hard at all  Food Insecurity: No Food Insecurity (03/10/2024)   Hunger Vital Sign    Worried About Running Out of Food in the Last Year: Never true    Ran Out of Food in the Last Year: Never true  Transportation Needs: No Transportation Needs (03/10/2024)   PRAPARE - Administrator, Civil Service (Medical): No    Lack of Transportation (Non-Medical): No  Physical Activity: Sufficiently Active (03/10/2024)   Exercise Vital Sign    Days of Exercise per Week: 3 days    Minutes of Exercise per Session: 120 min  Stress: Stress Concern Present (03/10/2024)   [employer] of Occupational Health - Occupational Stress Questionnaire    Feeling of Stress: To some extent  Social Connections: Moderately Isolated (03/10/2024)   Social Connection and Isolation Panel    Frequency of Communication with Friends and Family: Once a week    Frequency of Social Gatherings with Friends and Family: More than three times a week    Attends Religious Services: Never    Database administrator or Organizations: No    Attends Engineer, structural: Never    Marital Status: Married    Tobacco Counseling Counseling given: Not Answered    Clinical Intake:  Pre-visit preparation completed: Yes        BMI -  recorded: 24.82 Nutritional Status: BMI of 19-24  Normal Nutritional Risks: None Diabetes: No  No results found for: HGBA1C   How often do you need to have someone help you when you read instructions, pamphlets, or other written materials from your doctor or pharmacy?: 1 - Never What is the last grade level you completed in school?: Bachelor's degree  Interpreter Needed?: No  Information entered by :: Lolita Libra, CMA   Activities of Daily Living     03/03/2024    8:56 AM  In your present state of health, do you have any difficulty performing the following activities:  Hearing? 0  Vision? 0  Difficulty concentrating or making decisions? 0  Walking or climbing stairs? 0  Dressing or bathing? 0  Doing errands,  shopping? 0  Preparing Food and eating ? N  Using the Toilet? N  In the past six months, have you accidently leaked urine? N  Do you have problems with loss of bowel control? N  Managing your Medications? N  Managing your Finances? N  Housekeeping or managing your Housekeeping? N    Patient Care Team: Amon Aloysius BRAVO, MD as PCP - General (Internal Medicine) Shana Steffan ORN, MD as Referring Physician (Gastroenterology) [facility] (Ophthalmology)  I have updated your Care Teams any recent Medical Services you may have received from other providers in the past year.     Assessment:   This is a routine wellness examination for Kam.  Hearing/Vision screen Hearing Screening - Comments:: Denies hearing difficulties.  Vision Screening - Comments:: Wears RX glasses -- up to date with routine eye exams. [facility] care.    Goals Addressed   None    Depression Screen     03/10/2024   11:15 AM 09/24/2023   10:12 AM 03/05/2023   11:06 AM 09/18/2022    1:43 PM 07/25/2021    8:00 AM 07/18/2020    8:12 AM 07/13/2019    8:11 AM  PHQ 2/9 Scores  PHQ - 2 Score 0 0 0 0 0 0 0    Fall Risk     03/03/2024    8:56 AM 09/24/2023   10:11 AM 02/26/2023   10:25 AM  09/18/2022    1:43 PM 07/25/2021    8:01 AM  Fall Risk   Falls in the past year? 0 0 0 0 0  Number falls in past yr: 0 0 0 0 0  Injury with Fall? 0 0 0 0 0  Risk for fall due to :   No Fall Risks    Follow up  Falls evaluation completed;Education provided Falls evaluation completed Falls evaluation completed  Falls evaluation completed      Data saved with a previous flowsheet row definition    MEDICARE RISK AT HOME:  Medicare Risk at Home Any stairs in or around the home?: (Patient-Rptd) Yes If so, are there any without handrails?: (Patient-Rptd) No Home free of loose throw rugs in walkways, pet beds, electrical cords, etc?: (Patient-Rptd) No Adequate lighting in your home to reduce risk of falls?: (Patient-Rptd) Yes Life alert?: (Patient-Rptd) No Use of a cane, walker or w/c?: (Patient-Rptd) No Grab bars in the bathroom?: (Patient-Rptd) No Shower chair or bench in shower?: (Patient-Rptd) No Elevated toilet seat or a handicapped toilet?: (Patient-Rptd) Yes  TIMED UP AND GO:  Was the test performed?  No, audio Cognitive Function: 6CIT completed        03/10/2024   11:07 AM 03/05/2023   11:07 AM  6CIT Screen  What Year? 0 points 0 points  What month? 0 points 0 points  What time? 0 points 0 points  Count back from 20 0 points 0 points  Months in reverse 0 points 0 points  Repeat phrase 0 points 0 points  Total Score 0 points 0 points    Immunizations Immunization History  Administered Date(s) Administered   Fluad Quad(high Dose 65+) 06/23/2022   Influenza,inj,Quad PF,6+ Mos 07/05/2018, 07/13/2019, 07/18/2020   Influenza-Unspecified 07/15/2021, 07/27/2023   PFIZER(Purple Top)SARS-COV-2 Vaccination 12/01/2019, 12/22/2019, 08/14/2020   Pfizer Covid-19 Vaccine Bivalent Booster 69yrs & up 07/23/2021   Tdap 11/26/2015   Zoster Recombinant(Shingrix ) 07/13/2019, 10/13/2019    Screening Tests Health Maintenance  Topic Date Due   COVID-19 Vaccine (5 - 2024-25 season)  05/16/2023   Medicare Annual Wellness (AWV)  03/04/2024   Pneumococcal Vaccine: 50+ Years (1 of 2 - PCV) 09/23/2024 (Originally 03/02/1975)   INFLUENZA VACCINE  04/14/2024   DTaP/Tdap/Td (2 - Td or Tdap) 11/25/2025   Fecal DNA (Cologuard)  03/04/2027   Hepatitis C Screening  Completed   Zoster Vaccines- Shingrix   Completed   Hepatitis B Vaccines  Aged Out   HPV VACCINES  Aged Out   Meningococcal B Vaccine  Aged Out   Colonoscopy  Discontinued    Health Maintenance  Health Maintenance Due  Topic Date Due   COVID-19 Vaccine (5 - 2024-25 season) 05/16/2023   Medicare Annual Wellness (AWV)  03/04/2024   Health Maintenance Items Addressed: All HM up to date  Additional Screening:  Vision Screening: Recommended annual ophthalmology exams for early detection of glaucoma and other disorders of the eye. Would you like a referral to an eye doctor? No    Dental Screening: Recommended annual dental exams for proper oral hygiene  Community Resource Referral / Chronic Care Management: CRR required this visit?  No   CCM required this visit?  No   Plan:    I have personally reviewed and noted the following in the patient's chart:   Medical and social history Use of alcohol, tobacco or illicit drugs  Current medications and supplements including opioid prescriptions. Patient is not currently taking opioid prescriptions. Functional ability and status Nutritional status Physical activity Advanced directives List of other physicians Hospitalizations, surgeries, and ER visits in previous 12 months Vitals Screenings to include cognitive, depression, and falls Referrals and appointments  In addition, I have reviewed and discussed with patient certain preventive protocols, quality metrics, and best practice recommendations. A written personalized care plan for preventive services as well as general preventive health recommendations were provided to patient.   Lolita Libra,  CMA   03/10/2024   After Visit Summary: (MyChart) Due to this being a telephonic visit, the after visit summary with patients personalized plan was offered to patient via MyChart   Notes: Please refer to Routing Comments.

## 2024-03-10 NOTE — Addendum Note (Signed)
 Addended by: MAREE HUM A on: 03/10/2024 02:49 PM   Modules accepted: Level of Service

## 2024-03-10 NOTE — Progress Notes (Signed)
I have reviewed and agree with [facility] documentation.  Kathlene November, MD

## 2024-03-10 NOTE — Patient Instructions (Addendum)
 [name] , Thank you for taking time out of your busy schedule to complete your Annual Wellness Visit with me. I enjoyed our conversation and look forward to speaking with you again next year. I, as well as your care team,  appreciate your ongoing commitment to your health goals. Please review the following plan we discussed and let me know if I can assist you in the future. Your Game plan/ To Do List    Follow up Visits: Next Medicare AWV with our clinical staff: 03/23/25 11am    Next Office Visit with your provider: 09/29/24 8:20am  Clinician Recommendations:  Aim for 30 minutes of exercise or brisk walking, 6-8 glasses of water, and 5 servings of fruits and vegetables each day.       This is a list of the screening recommended for you and due dates:  Health Maintenance  Topic Date Due   Pneumococcal Vaccine for age over 76 (1 of 2 - PCV) 09/23/2024*   COVID-19 Vaccine (5 - 2024-25 season) 03/10/2025*   Flu Shot  04/14/2024   Medicare Annual Wellness Visit  03/10/2025   DTaP/Tdap/Td vaccine (2 - Td or Tdap) 11/25/2025   Cologuard (Stool DNA test)  03/04/2027   Hepatitis C Screening  Completed   Zoster (Shingles) Vaccine  Completed   Hepatitis B Vaccine  Aged Out   HPV Vaccine  Aged Out   Meningitis B Vaccine  Aged Out   Colon Cancer Screening  Discontinued  *Topic was postponed. The date shown is not the original due date.    Advanced directives: (Copy Requested) Please bring a copy of your health care power of attorney and living will to the office to be added to your chart at your convenience. You can mail to [facility] [address] [place], [place] [address] or email to [email or web address] Advance Care Planning is important because it:  [x]  Makes sure you receive the medical care that is consistent with your values, goals, and preferences  [x]  It provides guidance to your family and loved ones and reduces their decisional burden about whether or  not they are making the right decisions based on your wishes.  Follow the link provided in your after visit summary or read over the paperwork we have mailed to you to help you started getting your Advance Directives in place. If you need assistance in completing these, please reach out to us  so that we can help you!  I hope all goes well with your upcoming surgery!  See attachments for Preventive Care and Fall Prevention Tips.

## 2024-04-06 DIAGNOSIS — L814 Other melanin hyperpigmentation: Secondary | ICD-10-CM | POA: Diagnosis not present

## 2024-04-06 DIAGNOSIS — D229 Melanocytic nevi, unspecified: Secondary | ICD-10-CM | POA: Diagnosis not present

## 2024-04-06 DIAGNOSIS — L821 Other seborrheic keratosis: Secondary | ICD-10-CM | POA: Diagnosis not present

## 2024-04-06 DIAGNOSIS — L578 Other skin changes due to chronic exposure to nonionizing radiation: Secondary | ICD-10-CM | POA: Diagnosis not present

## 2024-04-07 DIAGNOSIS — N393 Stress incontinence (female) (male): Secondary | ICD-10-CM | POA: Diagnosis not present

## 2024-04-07 DIAGNOSIS — M62838 Other muscle spasm: Secondary | ICD-10-CM | POA: Diagnosis not present

## 2024-04-07 DIAGNOSIS — M6281 Muscle weakness (generalized): Secondary | ICD-10-CM | POA: Diagnosis not present

## 2024-04-18 DIAGNOSIS — C61 Malignant neoplasm of prostate: Secondary | ICD-10-CM | POA: Diagnosis not present

## 2024-04-18 DIAGNOSIS — Z01818 Encounter for other preprocedural examination: Secondary | ICD-10-CM | POA: Diagnosis not present

## 2024-04-20 NOTE — Progress Notes (Signed)
 COVID Vaccine Completed: yes  Date of COVID positive in last 90 days:  PCP - Aloysius Mech, MD Cardiologist - Charmaine Rase, PA LOV 03/12/22  PET- 02/08/24 Epic Chest x-ray - n/a EKG - 04/21/24 Epic/chart Stress Test - n/a ECHO - 03/12/22 [facility] Cardiac Cath - n/a Pacemaker/ICD device last checked: n/a Spinal Cord Stimulator: n/a  Bowel Prep - clears day before, magnesium citrate and fleet enema. Patient aware  Sleep Study - n/a CPAP -   Fasting Blood Sugar - n/a Checks Blood Sugar _____ times a day  Last dose of GLP1 agonist-  N/A GLP1 instructions:  Do not take after     Last dose of SGLT-2 inhibitors-  N/A SGLT-2 instructions:  Do not take after     Blood Thinner Instructions:  Last dose:   Time: Aspirin Instructions: ASA 325, on hold for surgery Last Dose:  Activity level: Can go up a flight of stairs and perform activities of daily living without stopping and without symptoms of chest pain or shortness of breath.  Anesthesia review: hyperlipidemia, SVT, has not seen cards since 2023, asthma  Patient denies shortness of breath, fever, cough and chest pain at PAT appointment  Patient verbalized understanding of instructions that were given to them at the PAT appointment. Patient was also instructed that they will need to review over the PAT instructions again at home before surgery.

## 2024-04-20 NOTE — Patient Instructions (Signed)
 SURGICAL WAITING ROOM VISITATION  Patients having surgery or a procedure may have no more than 2 support people in the waiting area - these visitors may rotate.    Children under the age of 57 must have an adult with them who is not the patient.  Visitors with respiratory illnesses are discouraged from visiting and should remain at home.  If the patient needs to stay at the hospital during part of their recovery, the visitor guidelines for inpatient rooms apply. Pre-op nurse will coordinate an appropriate time for 1 support person to accompany patient in pre-op.  This support person may not rotate.    Please refer to the [facility] website for the visitor guidelines for Inpatients (after your surgery is over and you are in a regular room).    Your procedure is scheduled on: 04/27/24   Report to [facility] Main Entrance    Report to admitting at 9:00 AM   Call this number if you have problems the morning of surgery [phone]   Follow a clear liquid diet the day before surgery  Water Non-Citrus Juices (without pulp, NO RED-Apple, White grape, White cranberry) Black Coffee (NO MILK/CREAM OR CREAMERS, sugar ok)  Clear Tea (NO MILK/CREAM OR CREAMERS, sugar ok) regular and decaf                             Plain Jell-O (NO RED)                                           Fruit ices (not with fruit pulp, NO RED)                                     Popsicles (NO RED)                                                               Sports drinks like Gatorade (NO RED)              Nothing to drink after midnight          If you have questions, please contact your surgeon's office.   FOLLOW BOWEL PREP AND ANY ADDITIONAL PRE OP INSTRUCTIONS YOU RECEIVED FROM YOUR SURGEON'S OFFICE!!!     Oral Hygiene is also important to reduce your risk of infection.                                    Remember - BRUSH YOUR TEETH THE MORNING OF SURGERY WITH YOUR REGULAR TOOTHPASTE  DENTURES WILL  BE REMOVED PRIOR TO SURGERY PLEASE DO NOT APPLY Poly grip OR ADHESIVES!!!   Stop all vitamins and herbal supplements 7 days before surgery.   Take these medicines the morning of surgery with A SIP OF WATER: Inhalers                               You may not  have any metal on your body including jewelry, and body piercing             Do not wear lotions, powders, cologne, or deodorant              Men may shave face and neck.   Do not bring valuables to the hospital. [facility] IS NOT             RESPONSIBLE   FOR VALUABLES.   Contacts, glasses, dentures or bridgework may not be worn into surgery.   Bring small overnight bag day of surgery.   DO NOT BRING YOUR HOME MEDICATIONS TO THE HOSPITAL. PHARMACY WILL DISPENSE MEDICATIONS LISTED ON YOUR MEDICATION LIST TO YOU DURING YOUR ADMISSION IN THE HOSPITAL!   Special Instructions: Bring a copy of your healthcare power of attorney and living will documents the day of surgery if you haven't scanned them before.              Please read over the following fact sheets you were given: IF YOU HAVE QUESTIONS ABOUT YOUR PRE-OP INSTRUCTIONS PLEASE CALL [phone].   If you received a COVID test during your pre-op visit  it is requested that you wear a mask when out in public, stay away from anyone that may not be feeling well and notify your surgeon if you develop symptoms. If you test positive for Covid or have been in contact with anyone that has tested positive in the last 10 days please notify you surgeon.    [facility] - Preparing for Surgery Before surgery, you can play an important role.  Because skin is not sterile, your skin needs to be as free of germs as possible.  You can reduce the number of germs on your skin by washing with CHG (chlorahexidine gluconate) soap before surgery.  CHG is an antiseptic cleaner which kills germs and bonds with the skin to continue killing germs even after washing. Please DO NOT use if you have  an allergy to CHG or antibacterial soaps.  If your skin becomes reddened/irritated stop using the CHG and inform your nurse when you arrive at Short Stay. Do not shave (including legs and underarms) for at least 48 hours prior to the first CHG shower.  You may shave your face/neck.  Please follow these instructions carefully:  1.  Shower with CHG Soap the night before surgery and the  morning of surgery.  2.  If you choose to wash your hair, wash your hair first as usual with your normal  shampoo.  3.  After you shampoo, rinse your hair and body thoroughly to remove the shampoo.                             4.  Use CHG as you would any other liquid soap.  You can apply chg directly to the skin and wash.  Gently with a scrungie or clean washcloth.  5.  Apply the CHG Soap to your body ONLY FROM THE NECK DOWN.   Do   not use on face/ open                           Wound or open sores. Avoid contact with eyes, ears mouth and   genitals (private parts).  Wash face,  Genitals (private parts) with your normal soap.             6.  Wash thoroughly, paying special attention to the area where your    surgery  will be performed.  7.  Thoroughly rinse your body with warm water from the neck down.  8.  DO NOT shower/wash with your normal soap after using and rinsing off the CHG Soap.                9.  Pat yourself dry with a clean towel.            10.  Wear clean pajamas.            11.  Place clean sheets on your bed the night of your first shower and do not  sleep with pets. Day of Surgery : Do not apply any lotions/deodorants the morning of surgery.  Please wear clean clothes to the hospital/surgery center.  FAILURE TO FOLLOW THESE INSTRUCTIONS MAY RESULT IN THE CANCELLATION OF YOUR SURGERY  PATIENT SIGNATURE_________________________________  NURSE SIGNATURE__________________________________  ________________________________________________________________________ WHAT IS A  BLOOD TRANSFUSION? Blood Transfusion Information  A transfusion is the replacement of blood or some of its parts. Blood is made up of multiple cells which provide different functions. Red blood cells carry oxygen and are used for blood loss replacement. White blood cells fight against infection. Platelets control bleeding. Plasma helps clot blood. Other blood products are available for specialized needs, such as hemophilia or other clotting disorders. BEFORE THE TRANSFUSION  Who gives blood for transfusions?  Healthy volunteers who are fully evaluated to make sure their blood is safe. This is blood bank blood. Transfusion therapy is the safest it has ever been in the practice of medicine. Before blood is taken from a donor, a complete history is taken to make sure that person has no history of diseases nor engages in risky social behavior (examples are intravenous drug use or sexual activity with multiple partners). The donor's travel history is screened to minimize risk of transmitting infections, such as malaria. The donated blood is tested for signs of infectious diseases, such as HIV and hepatitis. The blood is then tested to be sure it is compatible with you in order to minimize the chance of a transfusion reaction. If you or a relative donates blood, this is often done in anticipation of surgery and is not appropriate for emergency situations. It takes many days to process the donated blood. RISKS AND COMPLICATIONS Although transfusion therapy is very safe and saves many lives, the main dangers of transfusion include:  Getting an infectious disease. Developing a transfusion reaction. This is an allergic reaction to something in the blood you were given. Every precaution is taken to prevent this. The decision to have a blood transfusion has been considered carefully by your caregiver before blood is given. Blood is not given unless the benefits outweigh the risks. AFTER THE TRANSFUSION Right  after receiving a blood transfusion, you will usually feel much better and more energetic. This is especially true if your red blood cells have gotten low (anemic). The transfusion raises the level of the red blood cells which carry oxygen, and this usually causes an energy increase. The nurse administering the transfusion will monitor you carefully for complications. HOME CARE INSTRUCTIONS  No special instructions are needed after a transfusion. You may find your energy is better. Speak with your caregiver about any limitations on activity for underlying diseases you may  have. SEEK MEDICAL CARE IF:  Your condition is not improving after your transfusion. You develop redness or irritation at the intravenous (IV) site. SEEK IMMEDIATE MEDICAL CARE IF:  Any of the following symptoms occur over the next 12 hours: Shaking chills. You have a temperature by mouth above 102 F (38.9 C), not controlled by medicine. Chest, back, or muscle pain. People around you feel you are not acting correctly or are confused. Shortness of breath or difficulty breathing. Dizziness and fainting. You get a rash or develop hives. You have a decrease in urine output. Your urine turns a dark color or changes to pink, red, or brown. Any of the following symptoms occur over the next 10 days: You have a temperature by mouth above 102 F (38.9 C), not controlled by medicine. Shortness of breath. Weakness after normal activity. The white part of the eye turns yellow (jaundice). You have a decrease in the amount of urine or are urinating less often. Your urine turns a dark color or changes to pink, red, or brown. Document Released: 08/28/2000 Document Revised: 11/23/2011 Document Reviewed: 04/16/2008 [facility] Patient Information 2014 [place], [place].  _______________________________________________________________________

## 2024-04-21 ENCOUNTER — Other Ambulatory Visit: Payer: Self-pay | Admitting: Urology

## 2024-04-21 ENCOUNTER — Encounter (HOSPITAL_COMMUNITY)
Admission: RE | Admit: 2024-04-21 | Discharge: 2024-04-21 | Disposition: A | Discharge status: Home / Self Care | Source: Ambulatory Visit | Attending: Urology | Admitting: Urology

## 2024-04-21 ENCOUNTER — Encounter (HOSPITAL_COMMUNITY): Payer: Self-pay

## 2024-04-21 ENCOUNTER — Other Ambulatory Visit: Payer: Self-pay

## 2024-04-21 VITALS — BP 144/88 | HR 78 | Temp 98.6°F | Resp 16 | Ht 72.0 in | Wt 174.0 lb

## 2024-04-21 DIAGNOSIS — C61 Malignant neoplasm of prostate: Secondary | ICD-10-CM | POA: Insufficient documentation

## 2024-04-21 DIAGNOSIS — I2510 Atherosclerotic heart disease of native coronary artery without angina pectoris: Secondary | ICD-10-CM | POA: Diagnosis not present

## 2024-04-21 DIAGNOSIS — Z01818 Encounter for other preprocedural examination: Secondary | ICD-10-CM | POA: Diagnosis not present

## 2024-04-21 DIAGNOSIS — R9431 Abnormal electrocardiogram [ECG] [EKG]: Secondary | ICD-10-CM | POA: Diagnosis not present

## 2024-04-21 DIAGNOSIS — K429 Umbilical hernia without obstruction or gangrene: Secondary | ICD-10-CM | POA: Diagnosis not present

## 2024-04-21 DIAGNOSIS — J45909 Unspecified asthma, uncomplicated: Secondary | ICD-10-CM | POA: Diagnosis not present

## 2024-04-21 DIAGNOSIS — I471 Supraventricular tachycardia, unspecified: Secondary | ICD-10-CM | POA: Diagnosis not present

## 2024-04-21 DIAGNOSIS — R7303 Prediabetes: Secondary | ICD-10-CM | POA: Insufficient documentation

## 2024-04-21 HISTORY — DX: Personal history of urinary calculi: Z87.442

## 2024-04-21 HISTORY — DX: Pneumonia, unspecified organism: J18.9

## 2024-04-21 LAB — CBC
HCT: 45.5 % (ref 39.0–52.0)
Hemoglobin: 14.6 g/dL (ref 13.0–17.0)
MCH: 29.5 pg (ref 26.0–34.0)
MCHC: 32.1 g/dL (ref 30.0–36.0)
MCV: 91.9 fL (ref 80.0–100.0)
Platelets: 256 K/uL (ref 150–400)
RBC: 4.95 MIL/uL (ref 4.22–5.81)
RDW: 12.9 % (ref 11.5–15.5)
WBC: 7.2 K/uL (ref 4.0–10.5)
nRBC: 0 % (ref 0.0–0.2)

## 2024-04-21 LAB — BASIC METABOLIC PANEL WITH GFR
Anion gap: 10 (ref 5–15)
BUN: 16 mg/dL (ref 8–23)
CO2: 25 mmol/L (ref 22–32)
Calcium: 9.1 mg/dL (ref 8.9–10.3)
Chloride: 103 mmol/L (ref 98–111)
Creatinine, Ser: 1.05 mg/dL (ref 0.61–1.24)
GFR, Estimated: >60 mL/min (ref >60)
Glucose, Bld: 92 mg/dL (ref 70–99)
Potassium: 3.7 mmol/L (ref 3.5–5.1)
Sodium: 138 mmol/L (ref 135–145)

## 2024-04-21 NOTE — Progress Notes (Signed)
 Spelling error in consent order. Prostatec Called and spoke with Candie, RN from [facility] about getting error fixed.

## 2024-04-24 NOTE — Progress Notes (Signed)
 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 Last seen in 2023. TTE in 2023 revealed EF 55 to 60%, mildly dilated aortic sinus and no significant valve disease. Coronary calcium score July 2023:total coronary artery calcium score is 119, which places the patient in the 58th percentile  Last seen by PCP on 09/24/23 for routine f/u. All issues stable. Asthma well controlled. Advised f/u in 1 year.  VS: BP (!) 144/88   Pulse 78   Temp 37 C (Oral)   Resp 16   Ht 6' (1.829 m)   Wt 78.9 kg   SpO2 98%   BMI 23.60 kg/m   PROVIDERS: Amon Aloysius BRAVO, MD   LABS: Labs reviewed: Acceptable for surgery. (all labs ordered are listed, but only abnormal results are displayed)  Labs Reviewed  CBC  BASIC METABOLIC PANEL WITH GFR  TYPE AND SCREEN     IMAGES:  PET scan 02/08/24:  IMPRESSION: 1. Focal activity in the LEFT lobe of the prostate gland consistent with primary prostate adenocarcinoma. 2. Potential extracapsular extension of the above prostate cancer posterior LEFT of the prostate gland. 3. No evidence of metastatic adenopathy in the pelvis or periaortic retroperitoneum. 4. No evidence of visceral metastasis or skeletal metastasis.  EKG 04/21/24:  Normal sinus rhythm, rate 76 Left axis deviation Abnormal ECG  CV:  CT calcium score  03/27/22:  IMPRESSION:  Calcified atherosclerotic disease is evident within the coronary arterial distribution. The patient's total coronary artery calcium score is 119, which places the patient in the 58th percentile for individuals of matched age, gender and race/ethnicity.  Diffuse thickened lower esophagus is likely due to esophagitis or a small hiatal hernia. If esophageal cancer is concerned, further workup is needed.  Echo 03/12/2022:  SUMMARY The left ventricular size is normal. Left ventricular systolic function is normal. LV ejection fraction = 55-60%. Left ventricular filling pattern is normal. The right ventricle is normal in size and function. There was insufficient TR detected to calculate RV systolic pressure. IVC size was normal. There is no significant valvular stenosis or regurgitation. The aortic sinus is mildly dilated. There is no pericardial effusion. 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    Pneumonia    Prostate cancer [facility])     Past Surgical History:  Procedure Laterality Date   JOINT REPLACEMENT Left 08/2020   Knee replacement   REPLACEMENT TOTAL KNEE Left    ~08-2020   TONSILLECTOMY      MEDICATIONS:  aspirin EC 325 MG tablet   B COMPLEX-C PO   budesonide -formoterol  (BREYNA ) 80-4.5 MCG/ACT inhaler   diclofenac Sodium (VOLTAREN) 1 % GEL   HOMEOPATHIC PRODUCTS PO   ibuprofen (ADVIL) 200 MG tablet   Multiple Vitamin (MULTIVITAMIN WITH MINERALS) TABS tablet   Omega-3 Fatty Acids (SUSTAINABLE VEGAN OMEGA-3 PO)   POLICOSANOL PO   Red Yeast Rice Extract (RED YEAST RICE PO)   TART CHERRY PO   No current facility-administered medications for this encounter.   Burnard CHRISTELLA Odis DEVONNA [facility]  Short Stay/Anesthesiology [facility] Phone [phone] 04/24/2024 2:39 PM

## 2024-04-24 NOTE — Anesthesia Preprocedure Evaluation (Addendum)
 Anesthesia Evaluation  Patient identified by MRN, date of birth, ID band Patient awake    Reviewed: Allergy & Precautions, H&P , NPO status , Patient's Chart, lab work & pertinent test results  Airway Mallampati: I  TM Distance: >3 FB Neck ROM: Full    Dental no notable dental hx. (+) Teeth Intact, Dental Advisory Given, Caps   Pulmonary neg pulmonary ROS, asthma , pneumonia   Pulmonary exam normal breath sounds clear to auscultation       Cardiovascular Exercise Tolerance: Good negative cardio ROS Normal cardiovascular exam Rhythm:Regular Rate:Normal     Neuro/Psych negative neurological ROS  negative psych ROS   GI/Hepatic negative GI ROS, Neg liver ROS,,,  Endo/Other  negative endocrine ROS    Renal/GU negative Renal ROS  negative genitourinary   Musculoskeletal negative musculoskeletal ROS (+) Arthritis ,    Abdominal   Peds negative pediatric ROS (+)  Hematology negative hematology ROS (+)   Anesthesia Other Findings   Reproductive/Obstetrics negative OB ROS                              Anesthesia Physical Anesthesia Plan  ASA: 3  Anesthesia Plan: General   Post-op Pain Management: Tylenol  PO (pre-op)*, Celebrex  PO (pre-op)*, Gabapentin  PO (pre-op)* and Ketamine  IV*   Induction: Intravenous  PONV Risk Score and Plan: 1 and Ondansetron , Dexamethasone  and Treatment may vary due to age or medical condition  Airway Management Planned: Oral ETT  Additional Equipment: None  Intra-op Plan:   Post-operative Plan: Extubation in OR  Informed Consent: I have reviewed the patients History and Physical, chart, labs and discussed the procedure including the risks, benefits and alternatives for the proposed anesthesia with the patient or authorized representative who has indicated his/her understanding and acceptance.       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 Last seen in 2023. TTE in 2023 revealed EF 55 to 60%, mildly dilated aortic sinus and no significant valve disease. Coronary calcium score July 2023:total coronary artery calcium score is 119, which places the patient in the 58th percentile   Last seen by PCP on 09/24/23 for routine f/u. All issues stable. Asthma well controlled. Advised f/u in 1 year.  EKG 04/21/24:   Normal sinus rhythm, rate 76 Left axis deviation Abnormal ECG   CV:   CT calcium score 03/27/22:   IMPRESSION:   Calcified atherosclerotic disease is evident within the coronary arterial distribution. The patient's total coronary artery calcium score is 119, which places the patient in the 58th percentile for individuals of matched age, gender and race/ethnicity.   Diffuse thickened lower esophagus is likely due to esophagitis or a small hiatal hernia. If esophageal cancer is concerned, further workup is needed.   Echo 03/12/2022:   SUMMARY The left ventricular size is normal. Left ventricular systolic function is normal. LV ejection fraction = 55-60%. Left ventricular filling pattern is normal. The right ventricle is normal in size and function. There was insufficient TR detected to calculate RV systolic pressure. IVC size was normal. There is no significant valvular stenosis or regurgitation. The aortic sinus is mildly dilated. There is no pericardial effusion. Probably no significant change in  comparison with the prior study noted    )         Anesthesia Quick Evaluation

## 2024-04-26 NOTE — H&P (Signed)
 Office Visit Report     04/18/2024   --------------------------------------------------------------------------------   Tommy Mcbride  MRN: [id number]  DOB: 1956-04-01, 68 year old Male  SSN: [id number]   PRIMARY CARE:  Ozell DOROTHA Dopp, MD  PRIMARY CARE FAX:  [phone]  REFERRING:  Morene MICAEL Salines, MD  PROVIDER:  Morene Salines, M.D.  TREATING:  Ubaldo Eagles, NP  LOCATION:  [facility] [phone]     --------------------------------------------------------------------------------   CC/HPI: 04/18/2024: Patient here today for preoperative appointment prior to undergoing partial left nerve sparing and complete right nerve sparing robot-assisted laparoscopic radical prostatectomy and bilateral pelvic lymphadenectomy with Dr. Renda on 8/14. Patient has already been evaluated x 2 with pelvic floor physical therapy. He has been practicing the exercises already instructed to him in anticipation of his postoperative recovery. Overall patient doing well. Denies any changes in past medical history, prescription medications taken on daily basis. No interval surgical or procedural intervention. Baseline lower urinary tract symptoms are grossly stable, he has had no recent dysuria or gross hematuria, no interval treatment for UTI. He denies any recent fever/chills, nausea/vomiting or chest pain and shortness of breath.    CC: Prostate Cancer   Physician requesting consult: Dr. Odis Salines  PCP: Dr. Aloysius Mech or Dr. 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 Arvella Manly for a rise in his PSA and noted to have left sided prostate induration. A prostate MRI was performed on 12/24/23 and indicated a 9 mm PI-RADS 4 lesion of the left mid peripheral zone. An MR/US  fusion biopsy was performed by Dr. 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   Family history: Mother with breast cancer.   Imaging studies:  MRI (12/24/23): No clear EPE, SVI, LAD, or bone lesions.  PSMA PET scan (02/08/24): No metastatic disease. Uptake at the left prostate with question of EPE on the left.   PMH: He has a history of hyperlipidemia.  PSH: No abdominal surgeries.   TNM stage: cT1c N0 M0  PSA: 4.0  Gleason score: 4+4=8 (GG 4)  Biopsy (01/20/24): 7/15 cores positive  Left: L lateral mid (5%, 4+4=8), L mid (50%, 4+3=7), L lateral base (50%, 4+3=7), L base (90%, 4+4=8)  Right: Benign  MR target: 3/3 cores positive - 1 core with 4+4=8 in 70%, 2 cores with 4+3=7 in 70% and 60%  Prostate volume: 53.6 cc   Nomogram  OC disease: 41%  EPE: 57%  SVI: 11%  LNI: 41%  PFS (5 year, 10 year): 51%, 35%   Urinary function: IPSS is 4.  Erectile function: SHIM score is 25.     ALLERGIES: Penicillins - childhood    MEDICATIONS: B Complex  Multi-Vitamin TABS Oral  Osteo Bi-Flex Adv Double St CAPS Oral  Red Yeast Rice  Sustainable Vegan Omega-3  Symbicort   Tart Cherry     GU PSH: Prostate Needle Biopsy - 01/20/2024       PSH Notes: Tonsillectomy   NON-GU PSH: Remove Tonsils - 2014 Surgical Pathology, Gross And Microscopic Examination For Prostate Needle - 01/20/2024     GU PMH: Stress Incontinence - 04/07/2024 Prostate Cancer - 03/06/2024, - 02/29/2024 Elevated PSA - 01/20/2024 Spermatocele of epididymis, Unspec, Spermatocele - 2014 Testicular pain, unspecified, Testicular pain - 2014  PMH Notes:  1898-09-14 00:00:00 - Note: Normal Routine History And Physical Adult  2013-05-19 11:54:29 - Note: Arthritis   NON-GU PMH: Muscle weakness (generalized) - 04/07/2024, - 03/06/2024 Other muscle spasm - 04/07/2024 Asthma, Asthma - 2014 Personal history of other endocrine, nutritional and metabolic disease, History of hypercholesterolemia - 2014    FAMILY HISTORY: Blood In Urine - Runs In Family Family  Health Status - Father alive at age 83 - Father Family Health Status - Mother's Age - Father Family Health Status Number - Father Heart Disease - Brother nephrolithiasis - Father   SOCIAL HISTORY: Marital Status: Married     Notes: Alcohol Use, Never A Smoker, Occupation:, Marital History - Currently Married, Caffeine Use   REVIEW OF SYSTEMS:    GU Review Male:   Patient denies frequent urination, hard to postpone urination, burning/ pain with urination, get up at night to urinate, leakage of urine, stream starts and stops, trouble starting your stream, have to strain to urinate , erection problems, and penile pain.  Gastrointestinal (Upper):   Patient denies nausea, vomiting, and indigestion/ heartburn.  Gastrointestinal (Lower):   Patient denies diarrhea and constipation.  Constitutional:   Patient denies fever, night sweats, weight loss, and fatigue.  Skin:   Patient denies skin rash/ lesion and itching.  Eyes:   Patient denies blurred vision and double vision.  Ears/ Nose/ Throat:   Patient denies sore throat and sinus problems.  Hematologic/Lymphatic:   Patient denies swollen glands and easy bruising.  Cardiovascular:   Patient denies leg swelling and chest pains.  Respiratory:   Patient denies cough and shortness of breath.  Endocrine:   Patient denies excessive thirst.  Musculoskeletal:   Patient denies back pain and joint pain.  Neurological:   Patient denies headaches and dizziness.  Psychologic:   Patient denies depression and anxiety.   VITAL SIGNS:      04/18/2024 02:29 PM  Weight 180 lb / 81.65 kg  Height 72 in / 182.88 cm  BP 134/66 mmHg  Pulse 78 /min  Temperature 98.5 F / 36.9 C  BMI 24.4 kg/m   MULTI-SYSTEM PHYSICAL EXAMINATION:    Constitutional: Well-nourished. No physical deformities. Normally developed. Good grooming.  Neck: Neck symmetrical, not swollen. Normal tracheal position.  Respiratory: No labored breathing, no use of accessory muscles.    Cardiovascular: Normal temperature, normal extremity pulses, no swelling, no varicosities.  Skin: No paleness, no jaundice, no cyanosis. No lesion, no ulcer, no rash.  Neurologic / Psychiatric: Oriented to time, oriented to place, oriented to person. No depression, no anxiety, no agitation.  Gastrointestinal: No mass, no tenderness, no rigidity, non obese abdomen. He does have a small reducible umbilical hernia.  Musculoskeletal: Normal gait and station of head and neck.     Complexity of Data:  Source Of History:  Patient, Medical Record Summary  Lab Test Review:   PSA  Records Review:   Pathology Reports, Previous Doctor Records, Previous Hospital Records, Previous Patient Records  Urine Test Review:   Urinalysis  X-Ray Review: PET- PSMA Scan: Reviewed Report.     04/18/24  Urinalysis  Urine Appearance Clear   Urine Color Yellow   Urine Glucose Neg mg/dL  Urine Bilirubin Neg mg/dL  Urine Ketones Neg mg/dL  Urine Specific Gravity 1.025   Urine Blood Neg ery/uL  Urine pH 5.5   Urine Protein Neg mg/dL  Urine Urobilinogen 0.2 mg/dL  Urine Nitrites Neg   Urine Leukocyte Esterase Neg leu/uL   PROCEDURES:  Visit Complexity - G2211          Urinalysis Dipstick Dipstick Cont'd  Color: Yellow Bilirubin: Neg mg/dL  Appearance: Clear Ketones: Neg mg/dL  Specific Gravity: 8.974 Blood: Neg ery/uL  pH: 5.5 Protein: Neg mg/dL  Glucose: Neg mg/dL Urobilinogen: 0.2 mg/dL    Nitrites: Neg    Leukocyte Esterase: Neg leu/uL    ASSESSMENT:      ICD-10 Details  1 GU:   Prostate Cancer - C61 Chronic, Threat to Bodily Function  2 NON-GU:   Encounter for other preprocedural examination - Z01.818 Undiagnosed New Problem   PLAN:           Orders Labs Urine Culture          Schedule Return Visit/Planned Activity: Keep Scheduled Appointment - Follow up MD, Schedule Surgery          Document Letter(s):  Created for Patient: Clinical Summary         Notes:   All questions  answered to the best of my ability regarding the upcoming procedure and expected postoperative course with understanding expressed by the patient. Urine culture sent today to serve as precautionary baseline. He will proceed with previously scheduled robotic prostatectomy with Dr. Bevely on 8/14.        Next Appointment:      Next Appointment: 04/27/2024 11:15 AM    Appointment Type: Surgery     Location: [facility] [phone]    Provider: Gretel Ferrara, M.D.    Reason for Visit: WL/OBS RALP LEV 2 AND BPLND WITH AMANDA      * Signed by Ubaldo Eagles, NP on 04/18/24 at 2:56 PM (EDT)*

## 2024-04-27 ENCOUNTER — Other Ambulatory Visit: Payer: Self-pay

## 2024-04-27 ENCOUNTER — Ambulatory Visit (HOSPITAL_COMMUNITY): Payer: Self-pay | Admitting: Medical

## 2024-04-27 ENCOUNTER — Observation Stay (HOSPITAL_COMMUNITY)
Admission: RE | Admit: 2024-04-27 | Discharge: 2024-04-28 | Disposition: A | Discharge status: Home / Self Care | Attending: Urology | Admitting: Urology

## 2024-04-27 ENCOUNTER — Encounter (HOSPITAL_COMMUNITY): Payer: Self-pay | Admitting: Urology

## 2024-04-27 ENCOUNTER — Encounter (HOSPITAL_COMMUNITY)
Admission: RE | Disposition: A | Discharge status: Home / Self Care | Payer: Self-pay | Source: Home / Self Care | Attending: Urology

## 2024-04-27 ENCOUNTER — Ambulatory Visit (HOSPITAL_BASED_OUTPATIENT_CLINIC_OR_DEPARTMENT_OTHER): Payer: Self-pay | Admitting: Anesthesiology

## 2024-04-27 DIAGNOSIS — I471 Supraventricular tachycardia, unspecified: Secondary | ICD-10-CM

## 2024-04-27 DIAGNOSIS — C61 Malignant neoplasm of prostate: Principal | ICD-10-CM | POA: Diagnosis present

## 2024-04-27 DIAGNOSIS — K429 Umbilical hernia without obstruction or gangrene: Secondary | ICD-10-CM | POA: Insufficient documentation

## 2024-04-27 DIAGNOSIS — I2510 Atherosclerotic heart disease of native coronary artery without angina pectoris: Secondary | ICD-10-CM | POA: Diagnosis not present

## 2024-04-27 DIAGNOSIS — F1090 Alcohol use, unspecified, uncomplicated: Secondary | ICD-10-CM | POA: Insufficient documentation

## 2024-04-27 DIAGNOSIS — Z7982 Long term (current) use of aspirin: Secondary | ICD-10-CM | POA: Diagnosis not present

## 2024-04-27 DIAGNOSIS — J45909 Unspecified asthma, uncomplicated: Secondary | ICD-10-CM | POA: Insufficient documentation

## 2024-04-27 DIAGNOSIS — Z01818 Encounter for other preprocedural examination: Secondary | ICD-10-CM | POA: Insufficient documentation

## 2024-04-27 DIAGNOSIS — E785 Hyperlipidemia, unspecified: Secondary | ICD-10-CM

## 2024-04-27 HISTORY — PX: ROBOT ASSISTED LAPAROSCOPIC RADICAL PROSTATECTOMY: SHX5141

## 2024-04-27 HISTORY — PX: UMBILICAL HERNIA REPAIR: SHX196

## 2024-04-27 LAB — HEMOGLOBIN AND HEMATOCRIT, BLOOD
HCT: 43 % (ref 39.0–52.0)
Hemoglobin: 13.7 g/dL (ref 13.0–17.0)

## 2024-04-27 LAB — TYPE AND SCREEN
ABO/RH(D): O POS
Antibody Screen: NEGATIVE

## 2024-04-27 LAB — ABO/RH: ABO/RH(D): O POS

## 2024-04-27 SURGERY — PROSTATECTOMY, RADICAL, ROBOT-ASSISTED, LAPAROSCOPIC
Anesthesia: General

## 2024-04-27 MED ORDER — PHENYLEPHRINE HCL-NACL 20-0.9 MG/250ML-% IV SOLN
INTRAVENOUS | Status: AC
  Filled 2024-04-27: qty 250

## 2024-04-27 MED ORDER — PHENYLEPHRINE HCL-NACL 20-0.9 MG/250ML-% IV SOLN
INTRAVENOUS | Status: DC | PRN
  Administered 2024-04-27: 25 ug/min via INTRAVENOUS

## 2024-04-27 MED ORDER — EPHEDRINE SULFATE-NACL 50-0.9 MG/10ML-% IV SOSY
PREFILLED_SYRINGE | INTRAVENOUS | Status: DC | PRN
  Administered 2024-04-27 (×3): 5 mg via INTRAVENOUS

## 2024-04-27 MED ORDER — FENTANYL CITRATE PF 50 MCG/ML IJ SOSY
25.0000 ug | PREFILLED_SYRINGE | INTRAMUSCULAR | Status: DC | PRN
  Administered 2024-04-27 (×2): 50 ug via INTRAVENOUS

## 2024-04-27 MED ORDER — ACETAMINOPHEN 500 MG PO TABS
1000.0000 mg | ORAL_TABLET | Freq: Once | ORAL | Status: AC
  Administered 2024-04-27: 1000 mg via ORAL
  Filled 2024-04-27: qty 2

## 2024-04-27 MED ORDER — ONDANSETRON HCL 4 MG/2ML IJ SOLN
INTRAMUSCULAR | Status: DC | PRN
  Administered 2024-04-27: 4 mg via INTRAVENOUS

## 2024-04-27 MED ORDER — FENTANYL CITRATE (PF) 100 MCG/2ML IJ SOLN
INTRAMUSCULAR | Status: AC
  Filled 2024-04-27: qty 2

## 2024-04-27 MED ORDER — MIDAZOLAM HCL 2 MG/2ML IJ SOLN
INTRAMUSCULAR | Status: AC
  Filled 2024-04-27: qty 2

## 2024-04-27 MED ORDER — PHENYLEPHRINE 80 MCG/ML (10ML) SYRINGE FOR IV PUSH (FOR BLOOD PRESSURE SUPPORT)
PREFILLED_SYRINGE | INTRAVENOUS | Status: AC
Start: 2024-04-27 — End: 2024-04-27
  Filled 2024-04-27: qty 10

## 2024-04-27 MED ORDER — ROCURONIUM BROMIDE 10 MG/ML (PF) SYRINGE
PREFILLED_SYRINGE | INTRAVENOUS | Status: AC
  Filled 2024-04-27: qty 10

## 2024-04-27 MED ORDER — ZOLPIDEM TARTRATE 5 MG PO TABS: 5.0000 mg | ORAL_TABLET | Freq: Every evening | ORAL | Status: DC | PRN

## 2024-04-27 MED ORDER — PROPOFOL 10 MG/ML IV BOLUS
INTRAVENOUS | Status: AC
  Filled 2024-04-27: qty 20

## 2024-04-27 MED ORDER — ONDANSETRON HCL 4 MG/2ML IJ SOLN
INTRAMUSCULAR | Status: AC
  Filled 2024-04-27: qty 2

## 2024-04-27 MED ORDER — KETOROLAC TROMETHAMINE 15 MG/ML IJ SOLN
INTRAMUSCULAR | Status: AC
  Filled 2024-04-27: qty 1

## 2024-04-27 MED ORDER — FENTANYL CITRATE (PF) 100 MCG/2ML IJ SOLN
INTRAMUSCULAR | Status: AC
Start: 2024-04-27 — End: 2024-04-27
  Filled 2024-04-27: qty 2

## 2024-04-27 MED ORDER — ONDANSETRON HCL 4 MG/2ML IJ SOLN
4.0000 mg | INTRAMUSCULAR | Status: DC | PRN
Start: 2024-04-27 — End: 2024-04-28
  Administered 2024-04-27: 4 mg via INTRAVENOUS
  Filled 2024-04-27: qty 2

## 2024-04-27 MED ORDER — DOCUSATE SODIUM 100 MG PO CAPS
100.0000 mg | ORAL_CAPSULE | Freq: Two times a day (BID) | ORAL | Status: DC
  Administered 2024-04-27 – 2024-04-28 (×2): 100 mg via ORAL
  Filled 2024-04-27 (×2): qty 1

## 2024-04-27 MED ORDER — LIDOCAINE HCL (PF) 2 % IJ SOLN
INTRAMUSCULAR | Status: AC
Start: 2024-04-27 — End: 2024-04-27
  Filled 2024-04-27: qty 5

## 2024-04-27 MED ORDER — TRIPLE ANTIBIOTIC 3.5-400-5000 EX OINT: 1.0000 | TOPICAL_OINTMENT | Freq: Three times a day (TID) | CUTANEOUS | Status: DC | PRN

## 2024-04-27 MED ORDER — BUPIVACAINE-EPINEPHRINE (PF) 0.25% -1:200000 IJ SOLN
INTRAMUSCULAR | Status: AC
  Filled 2024-04-27: qty 30

## 2024-04-27 MED ORDER — GLYCOPYRROLATE 0.2 MG/ML IJ SOLN
INTRAMUSCULAR | Status: AC
  Filled 2024-04-27: qty 1

## 2024-04-27 MED ORDER — CELECOXIB 200 MG PO CAPS
200.0000 mg | ORAL_CAPSULE | Freq: Once | ORAL | Status: AC
  Administered 2024-04-27: 200 mg via ORAL
  Filled 2024-04-27: qty 1

## 2024-04-27 MED ORDER — ORAL CARE MOUTH RINSE: 15.0000 mL | Freq: Once | OROMUCOSAL | Status: AC

## 2024-04-27 MED ORDER — STERILE WATER FOR IRRIGATION IR SOLN
Status: DC | PRN
  Administered 2024-04-27: 1000 mL

## 2024-04-27 MED ORDER — KETAMINE HCL 50 MG/5ML IJ SOSY
PREFILLED_SYRINGE | INTRAMUSCULAR | Status: AC
  Filled 2024-04-27: qty 5

## 2024-04-27 MED ORDER — ONDANSETRON HCL 4 MG/2ML IJ SOLN: 4.0000 mg | Freq: Once | INTRAMUSCULAR | Status: DC | PRN

## 2024-04-27 MED ORDER — GABAPENTIN 300 MG PO CAPS
300.0000 mg | ORAL_CAPSULE | ORAL | Status: AC
  Administered 2024-04-27: 300 mg via ORAL
  Filled 2024-04-27: qty 1

## 2024-04-27 MED ORDER — OXYCODONE HCL 5 MG/5ML PO SOLN: 5.0000 mg | Freq: Once | ORAL | Status: DC | PRN

## 2024-04-27 MED ORDER — DIPHENHYDRAMINE HCL 12.5 MG/5ML PO ELIX: 12.5000 mg | ORAL_SOLUTION | Freq: Four times a day (QID) | ORAL | Status: DC | PRN

## 2024-04-27 MED ORDER — HEPARIN SODIUM (PORCINE) 1000 UNIT/ML IJ SOLN: INTRAMUSCULAR | Status: DC | PRN

## 2024-04-27 MED ORDER — MAGNESIUM CITRATE PO SOLN: 1.0000 | Freq: Once | ORAL | Status: DC

## 2024-04-27 MED ORDER — PHENYLEPHRINE 80 MCG/ML (10ML) SYRINGE FOR IV PUSH (FOR BLOOD PRESSURE SUPPORT)
PREFILLED_SYRINGE | INTRAVENOUS | Status: DC | PRN
  Administered 2024-04-27: 40 ug via INTRAVENOUS

## 2024-04-27 MED ORDER — LACTATED RINGERS IV SOLN: INTRAVENOUS | Status: DC

## 2024-04-27 MED ORDER — LIDOCAINE HCL (CARDIAC) PF 100 MG/5ML IV SOSY
PREFILLED_SYRINGE | INTRAVENOUS | Status: DC | PRN
  Administered 2024-04-27: 100 mg via INTRAVENOUS

## 2024-04-27 MED ORDER — HYOSCYAMINE SULFATE 0.125 MG SL SUBL: 0.1250 mg | SUBLINGUAL_TABLET | Freq: Four times a day (QID) | SUBLINGUAL | Status: DC | PRN

## 2024-04-27 MED ORDER — TRAMADOL HCL 50 MG PO TABS: 50.0000 mg | ORAL_TABLET | Freq: Four times a day (QID) | ORAL | 0 refills | Status: DC | PRN

## 2024-04-27 MED ORDER — SULFAMETHOXAZOLE-TRIMETHOPRIM 800-160 MG PO TABS: 1.0000 | ORAL_TABLET | Freq: Two times a day (BID) | ORAL | 0 refills | Status: DC

## 2024-04-27 MED ORDER — MORPHINE SULFATE (PF) 2 MG/ML IV SOLN
INTRAVENOUS | Status: AC
  Filled 2024-04-27: qty 1

## 2024-04-27 MED ORDER — KETAMINE HCL 50 MG/5ML IJ SOSY
PREFILLED_SYRINGE | INTRAMUSCULAR | Status: DC | PRN
  Administered 2024-04-27: 30 mg via INTRAVENOUS

## 2024-04-27 MED ORDER — FENTANYL CITRATE (PF) 100 MCG/2ML IJ SOLN
INTRAMUSCULAR | Status: DC | PRN
  Administered 2024-04-27: 100 ug via INTRAVENOUS
  Administered 2024-04-27 (×4): 50 ug via INTRAVENOUS

## 2024-04-27 MED ORDER — FLUTICASONE FUROATE-VILANTEROL 100-25 MCG/ACT IN AEPB
1.0000 | INHALATION_SPRAY | Freq: Every day | RESPIRATORY_TRACT | Status: DC
  Administered 2024-04-28: 1 via RESPIRATORY_TRACT
  Filled 2024-04-27: qty 28

## 2024-04-27 MED ORDER — SUGAMMADEX SODIUM 200 MG/2ML IV SOLN
INTRAVENOUS | Status: AC
  Filled 2024-04-27: qty 2

## 2024-04-27 MED ORDER — SUGAMMADEX SODIUM 200 MG/2ML IV SOLN
INTRAVENOUS | Status: DC | PRN
  Administered 2024-04-27: 315 mg via INTRAVENOUS

## 2024-04-27 MED ORDER — HEPARIN SODIUM (PORCINE) 1000 UNIT/ML IJ SOLN
INTRAMUSCULAR | Status: AC
Start: 2024-04-27 — End: 2024-04-27
  Filled 2024-04-27: qty 1

## 2024-04-27 MED ORDER — DOCUSATE SODIUM 100 MG PO CAPS: 100.0000 mg | ORAL_CAPSULE | Freq: Two times a day (BID) | ORAL | Status: DC

## 2024-04-27 MED ORDER — ROCURONIUM BROMIDE 100 MG/10ML IV SOLN
INTRAVENOUS | Status: DC | PRN
  Administered 2024-04-27: 100 mg via INTRAVENOUS
  Administered 2024-04-27: 40 mg via INTRAVENOUS

## 2024-04-27 MED ORDER — MORPHINE SULFATE (PF) 2 MG/ML IV SOLN
2.0000 mg | INTRAVENOUS | Status: DC | PRN
  Administered 2024-04-27 (×2): 2 mg via INTRAVENOUS
  Filled 2024-04-27: qty 1

## 2024-04-27 MED ORDER — SODIUM CHLORIDE 0.9 % IV BOLUS
1000.0000 mL | Freq: Once | INTRAVENOUS | Status: AC
  Administered 2024-04-27: 1000 mL via INTRAVENOUS

## 2024-04-27 MED ORDER — ROCURONIUM BROMIDE 10 MG/ML (PF) SYRINGE
PREFILLED_SYRINGE | INTRAVENOUS | Status: AC
Start: 2024-04-27 — End: 2024-04-27
  Filled 2024-04-27: qty 10

## 2024-04-27 MED ORDER — OXYCODONE HCL 5 MG PO TABS: 5.0000 mg | ORAL_TABLET | Freq: Once | ORAL | Status: DC | PRN

## 2024-04-27 MED ORDER — MIDAZOLAM HCL 5 MG/5ML IJ SOLN
INTRAMUSCULAR | Status: DC | PRN
  Administered 2024-04-27: 2 mg via INTRAVENOUS

## 2024-04-27 MED ORDER — LIDOCAINE HCL (PF) 2 % IJ SOLN
INTRAMUSCULAR | Status: AC
  Filled 2024-04-27: qty 5

## 2024-04-27 MED ORDER — FENTANYL CITRATE PF 50 MCG/ML IJ SOSY
PREFILLED_SYRINGE | INTRAMUSCULAR | Status: AC
  Filled 2024-04-27: qty 1

## 2024-04-27 MED ORDER — KCL IN DEXTROSE-NACL 20-5-0.45 MEQ/L-%-% IV SOLN
INTRAVENOUS | Status: DC
  Filled 2024-04-27 (×4): qty 1000

## 2024-04-27 MED ORDER — KETOROLAC TROMETHAMINE 15 MG/ML IJ SOLN
15.0000 mg | Freq: Four times a day (QID) | INTRAMUSCULAR | Status: DC
  Administered 2024-04-27 – 2024-04-28 (×4): 15 mg via INTRAVENOUS
  Filled 2024-04-27 (×4): qty 1

## 2024-04-27 MED ORDER — LACTATED RINGERS IV SOLN: INTRAVENOUS | Status: DC | PRN

## 2024-04-27 MED ORDER — FLEET ENEMA RE ENEM: 1.0000 | ENEMA | Freq: Once | RECTAL | Status: DC

## 2024-04-27 MED ORDER — PROPOFOL 10 MG/ML IV BOLUS
INTRAVENOUS | Status: DC | PRN
  Administered 2024-04-27: 200 mg via INTRAVENOUS

## 2024-04-27 MED ORDER — ACETAMINOPHEN 325 MG PO TABS
650.0000 mg | ORAL_TABLET | ORAL | Status: DC | PRN
  Administered 2024-04-28: 650 mg via ORAL
  Filled 2024-04-27: qty 2

## 2024-04-27 MED ORDER — CHLORHEXIDINE GLUCONATE 0.12 % MT SOLN
15.0000 mL | Freq: Once | OROMUCOSAL | Status: AC
  Administered 2024-04-27: 15 mL via OROMUCOSAL

## 2024-04-27 MED ORDER — DEXAMETHASONE SODIUM PHOSPHATE 10 MG/ML IJ SOLN
INTRAMUSCULAR | Status: DC | PRN
  Administered 2024-04-27: 10 mg via INTRAVENOUS

## 2024-04-27 MED ORDER — EPHEDRINE 5 MG/ML INJ
INTRAVENOUS | Status: AC
  Filled 2024-04-27: qty 5

## 2024-04-27 MED ORDER — BUPIVACAINE-EPINEPHRINE 0.25% -1:200000 IJ SOLN
INTRAMUSCULAR | Status: DC | PRN
  Administered 2024-04-27: 30 mL

## 2024-04-27 MED ORDER — CEFAZOLIN SODIUM-DEXTROSE 1-4 GM/50ML-% IV SOLN
1.0000 g | Freq: Three times a day (TID) | INTRAVENOUS | Status: AC
  Administered 2024-04-27 – 2024-04-28 (×2): 1 g via INTRAVENOUS
  Filled 2024-04-27 (×3): qty 50

## 2024-04-27 MED ORDER — DIPHENHYDRAMINE HCL 50 MG/ML IJ SOLN: 12.5000 mg | Freq: Four times a day (QID) | INTRAMUSCULAR | Status: DC | PRN

## 2024-04-27 MED ORDER — DEXAMETHASONE SODIUM PHOSPHATE 10 MG/ML IJ SOLN
INTRAMUSCULAR | Status: AC
  Filled 2024-04-27: qty 1

## 2024-04-27 MED ORDER — DEXMEDETOMIDINE HCL IN NACL 80 MCG/20ML IV SOLN
INTRAVENOUS | Status: DC | PRN
  Administered 2024-04-27: 6 ug via INTRAVENOUS

## 2024-04-27 MED ORDER — MEPERIDINE HCL 100 MG/ML IJ SOLN: 6.2500 mg | INTRAMUSCULAR | Status: DC | PRN

## 2024-04-27 MED ORDER — CEFAZOLIN SODIUM-DEXTROSE 2-4 GM/100ML-% IV SOLN
2.0000 g | Freq: Once | INTRAVENOUS | Status: AC
  Administered 2024-04-27: 2 g via INTRAVENOUS
  Filled 2024-04-27: qty 100

## 2024-04-27 MED ORDER — LABETALOL HCL 5 MG/ML IV SOLN
INTRAVENOUS | Status: AC
  Filled 2024-04-27: qty 4

## 2024-04-27 SURGICAL SUPPLY — 54 items
APPLICATOR COTTON TIP 6 STRL (MISCELLANEOUS) ×2 IMPLANT
BAG COUNTER SPONGE SURGICOUNT (BAG) IMPLANT
CATH FOLEY 2WAY SLVR 18FR 30CC (CATHETERS) ×2 IMPLANT
CATH ROBINSON RED A/P 16FR (CATHETERS) ×2 IMPLANT
CATH ROBINSON RED A/P 8FR (CATHETERS) ×2 IMPLANT
CATH TIEMANN FOLEY 18FR 5CC (CATHETERS) ×2 IMPLANT
CHLORAPREP W/TINT 26 (MISCELLANEOUS) ×2 IMPLANT
CLIP LIGATING HEM O LOK PURPLE (MISCELLANEOUS) ×2 IMPLANT
COVER SURGICAL LIGHT HANDLE (MISCELLANEOUS) ×2 IMPLANT
COVER TIP SHEARS 8 DVNC (MISCELLANEOUS) ×2 IMPLANT
CUTTER ECHEON FLEX ENDO 45 340 (ENDOMECHANICALS) ×2 IMPLANT
DERMABOND ADVANCED .7 DNX12 (GAUZE/BANDAGES/DRESSINGS) ×2 IMPLANT
DRAPE ARM DVNC X/XI (DISPOSABLE) ×8 IMPLANT
DRAPE COLUMN DVNC XI (DISPOSABLE) ×2 IMPLANT
DRAPE SURG IRRIG POUCH 19X23 (DRAPES) ×2 IMPLANT
DRIVER NDL LRG 8 DVNC XI (INSTRUMENTS) ×4 IMPLANT
DRIVER NDLE LRG 8 DVNC XI (INSTRUMENTS) ×4 IMPLANT
DRSG TEGADERM 4X4.75 (GAUZE/BANDAGES/DRESSINGS) ×2 IMPLANT
ELECT PENCIL ROCKER SW 15FT (MISCELLANEOUS) ×2 IMPLANT
ELECT REM PT RETURN 15FT ADLT (MISCELLANEOUS) ×2 IMPLANT
FORCEPS BPLR LNG DVNC XI (INSTRUMENTS) ×2 IMPLANT
FORCEPS PROGRASP DVNC XI (FORCEP) ×2 IMPLANT
GAUZE SPONGE 4X4 12PLY STRL (GAUZE/BANDAGES/DRESSINGS) ×2 IMPLANT
GLOVE BIO SURGEON STRL SZ 6.5 (GLOVE) ×2 IMPLANT
GLOVE SURG LX STRL 7.5 STRW (GLOVE) ×4 IMPLANT
GOWN STRL REUS W/ TWL XL LVL3 (GOWN DISPOSABLE) ×4 IMPLANT
GOWN STRL SURGICAL XL XLNG (GOWN DISPOSABLE) ×2 IMPLANT
HOLDER FOLEY CATH W/STRAP (MISCELLANEOUS) ×2 IMPLANT
IRRIGATION SUCT STRKRFLW 2 WTP (MISCELLANEOUS) ×2 IMPLANT
IV LACTATED RINGERS 1000ML (IV SOLUTION) ×2 IMPLANT
KIT TURNOVER KIT A (KITS) ×2 IMPLANT
NDL SAFETY ECLIPSE 18X1.5 (NEEDLE) IMPLANT
PACK ROBOT UROLOGY CUSTOM (CUSTOM PROCEDURE TRAY) ×2 IMPLANT
PLUG CATH AND CAP STRL 200 (CATHETERS) ×2 IMPLANT
RELOAD STAPLE 45 4.1 GRN THCK (STAPLE) ×2 IMPLANT
SCISSORS LAP 5X45 EPIX DISP (ENDOMECHANICALS) IMPLANT
SCISSORS MNPLR CVD DVNC XI (INSTRUMENTS) ×2 IMPLANT
SEAL UNIV 5-12 XI (MISCELLANEOUS) ×8 IMPLANT
SET TUBE SMOKE EVAC HIGH FLOW (TUBING) ×2 IMPLANT
SOL PREP POV-IOD 4OZ 10% (MISCELLANEOUS) ×2 IMPLANT
SOLUTION ELECTROSURG ANTI STCK (MISCELLANEOUS) ×2 IMPLANT
SPIKE FLUID TRANSFER (MISCELLANEOUS) ×2 IMPLANT
SUT ETHILON 3 0 PS 1 (SUTURE) ×2 IMPLANT
SUT MNCRL 3 0 RB1 (SUTURE) ×2 IMPLANT
SUT MNCRL 3 0 VIOLET RB1 (SUTURE) ×2 IMPLANT
SUT MNCRL AB 4-0 PS2 18 (SUTURE) ×4 IMPLANT
SUT NOVA 0 T19/GS 22DT (SUTURE) IMPLANT
SUT PDS PLUS AB 0 CT-2 (SUTURE) ×4 IMPLANT
SUT VIC AB 0 CT1 27XBRD ANTBC (SUTURE) ×4 IMPLANT
SUT VIC AB 2-0 SH 27X BRD (SUTURE) ×2 IMPLANT
SUT VIC AB 3-0 SH 27X BRD (SUTURE) IMPLANT
SYR 27GX1/2 1ML LL SAFETY (SYRINGE) ×2 IMPLANT
TROCAR Z THREAD OPTICAL 12X100 (TROCAR) IMPLANT
WATER STERILE IRR 1000ML POUR (IV SOLUTION) ×2 IMPLANT

## 2024-04-27 NOTE — Op Note (Signed)
 Preoperative diagnosis: Clinically localized adenocarcinoma of the prostate (clinical stage T1c N0 M0)  Postoperative diagnosis: Clinically localized adenocarcinoma of the prostate (clinical stage T1c N0 M0)  Procedure:  Robotic assisted laparoscopic radical prostatectomy (bilateral nerve sparing - partial left) Bilateral robotic assisted laparoscopic pelvic lymphadenectomy  Surgeon: Gretel CANDIE Renda Mickey. M.D.  Assistant: Alan Hammonds, PA-C  An assistant was required for this surgical procedure.  The duties of the assistant included but were not limited to suctioning, passing suture, camera manipulation, retraction. This procedure would not be able to be performed without an Geophysicist/field seismologist.  Anesthesia: General  Complications: None  EBL: 280 mL  IVF:  1000 mL crystalloid  Specimens: Prostate and seminal vesicles Right pelvic lymph nodes Left pelvic lymph nodes  Disposition of specimens: Pathology  Drains: 20 Fr coude catheter # 19 Blake pelvic drain  Indication: Tommy Mcbride is a 68 y.o. year old patient with clinically localized prostate cancer.  After a thorough review of the management options for treatment of prostate cancer, he elected to proceed with surgical therapy and the above procedure(s).  We have discussed the potential benefits and risks of the procedure, side effects of the proposed treatment, the likelihood of the patient achieving the goals of the procedure, and any potential problems that might occur during the procedure or recuperation. Informed consent has been obtained.  Description of procedure:  The patient was taken to the operating room and a general anesthetic was administered. He was given preoperative antibiotics, placed in the dorsal lithotomy position, and prepped and draped in the usual sterile fashion. Next a preoperative timeout was performed. A urethral catheter was placed into the bladder and a site was selected near the umbilicus for placement  of the camera port. This was placed using a standard open Hassan technique which allowed entry into the peritoneal cavity under direct vision and without difficulty. An 8 mm robotic port was placed and a pneumoperitoneum established. The camera was then used to inspect the abdomen and there was no evidence of any intra-abdominal injuries or other abnormalities. The remaining abdominal ports were then placed. 8 mm robotic ports were placed in the right lower quadrant, left lower quadrant, and far left lateral abdominal wall. A 5 mm port was placed in the right upper quadrant and a 12 mm port was placed in the right lateral abdominal wall for laparoscopic assistance. All ports were placed under direct vision without difficulty. The surgical cart was then docked.   Utilizing the cautery scissors, the bladder was reflected posteriorly allowing entry into the space of Retzius and identification of the endopelvic fascia and prostate. The periprostatic fat was then removed from the prostate allowing full exposure of the endopelvic fascia. The endopelvic fascia was then incised from the apex back to the base of the prostate bilaterally and the underlying levator muscle fibers were swept laterally off the prostate thereby isolating the dorsal venous complex. The dorsal vein was then stapled and divided with a 45 mm Flex Echelon stapler. Attention then turned to the bladder neck which was divided anteriorly thereby allowing entry into the bladder and exposure of the urethral catheter. The catheter balloon was deflated and the catheter was brought into the operative field and used to retract the prostate anteriorly. The posterior bladder neck was then examined and was divided allowing further dissection between the bladder and prostate posteriorly until the vasa deferentia and seminal vessels were identified. The vasa deferentia were isolated, divided, and lifted anteriorly. The seminal vesicles were  dissected down to their  tips with care to control the seminal vascular arterial blood supply. These structures were then lifted anteriorly and the space between Denonvillier's fascia and the anterior rectum was developed with a combination of sharp and blunt dissection. This isolated the vascular pedicles of the prostate.  The lateral prostatic fascia was then sharply incised allowing release of the neurovascular bundles bilaterally. The vascular pedicles of the prostate were then ligated with Weck clips between the prostate and neurovascular bundles and divided with sharp cold scissor dissection resulting in neurovascular bundle preservation. The neurovascular bundles were then separated off the apex of the prostate and urethra bilaterally.  A full right sided nerve spare was performed with a partial left nerve spare.  The urethra was then sharply transected allowing the prostate specimen to be disarticulated. The pelvis was copiously irrigated and hemostasis was ensured. There was no evidence for rectal injury.  Attention then turned to the right pelvic sidewall. The fibrofatty tissue between the external iliac vein, confluence of the iliac vessels, hypogastric artery, and Cooper's ligament was dissected free from the pelvic sidewall with care to preserve the obturator nerve. Weck clips were used for lymphostasis and hemostasis. An identical procedure was performed on the contralateral side and the lymphatic packets were removed for permanent pathologic analysis.  Attention then turned to the urethral anastomosis. A 2-0 Vicryl slip knot was placed between Denonvillier's fascia, the posterior bladder neck, and the posterior urethra to reapproximate these structures. A double-armed 3-0 Monocryl suture was then used to perform a 360 running tension-free anastomosis between the bladder neck and urethra. A new urethral catheter was then placed into the bladder and irrigated. There were no blood clots within the bladder and the  anastomosis appeared to be watertight. A #19 Blake drain was then brought through the left lateral 8 mm port site and positioned appropriately within the pelvis. It was secured to the skin with a nylon suture. The surgical cart was then undocked. The right lateral 12 mm port site was closed at the fascial level with a 0 Vicryl suture placed laparoscopically. All remaining ports were then removed under direct vision. The prostate specimen was removed intact within the Endopouch retrieval bag via the periumbilical camera port site. This fascial opening was closed with two running 0 PDS sutures. 0.25% Marcaine  was then injected into all port sites and all incisions were reapproximated at the skin level with 4-0 Monocryl subcuticular sutures and Dermabond. The patient appeared to tolerate the procedure well and without complications. The patient was able to be extubated and transferred to the recovery unit in satisfactory condition.   Gretel CANDIE Renda Teddie MD

## 2024-04-27 NOTE — Discharge Instructions (Signed)

## 2024-04-27 NOTE — Progress Notes (Signed)
 Patient ID: Tommy Mcbride, male   DOB: Mar 30, 1956, 68 y.o.   MRN: [id number]  Post-op note  Subjective: The patient is doing well.  No complaints.  Objective: Vital signs in last 24 hours: Temp:  [97.5 F (36.4 C)-97.6 F (36.4 C)] 97.5 F (36.4 C) (08/14 1430) Pulse Rate:  [82-93] 84 (08/14 1500) Resp:  [9-13] 11 (08/14 1500) BP: (121-136)/(67-79) 129/73 (08/14 1500) SpO2:  [98 %-100 %] 98 % (08/14 1500) Weight:  [78.9 kg] 78.9 kg (08/14 0928)  Intake/Output from previous day: No intake/output data recorded. Intake/Output this shift: Total I/O In: 1135 [I.V.:1035; IV Piggyback:100] Out: 280 [Blood:280]  Physical Exam:  General: Alert and oriented. Abdomen: Soft, Nondistended. Incisions: Clean and dry. GU: Urine clear.  Lab Results: Recent Labs    04/27/24 1345  HGB 13.7  HCT 43.0    Assessment/Plan: POD#0   1) Continue to monitor, ambulate, IS   Tommy Mcbride. MD   LOS: 0 days   Tommy Mcbride 04/27/2024, 3:48 PM

## 2024-04-27 NOTE — Transfer of Care (Signed)
 Immediate Anesthesia Transfer of Care Note  Patient: Tommy Mcbride  Procedure(s) Performed: PROSTATECTOMY, RADICAL, ROBOT-ASSISTED, LAPAROSCOPIC LYMPHADENECTOMY, PELVIS, ROBOT-ASSISTED (Bilateral) REPAIR, HERNIA, UMBILICAL, ADULT  Patient Location: PACU  Anesthesia Type:General  Level of Consciousness: awake, alert , oriented, and patient cooperative  Airway & Oxygen Therapy: Patient Spontanous Breathing and Patient connected to face mask oxygen  Post-op Assessment: Report given to RN and Post -op Vital signs reviewed and stable  Post vital signs: Reviewed and stable  Last Vitals:  Vitals Value Taken Time  BP 121/74 04/27/24 13:32  Temp    Pulse 93 04/27/24 13:34  Resp 11 04/27/24 13:34  SpO2 99 % 04/27/24 13:34  Vitals shown include unfiled device data.  Last Pain:  Vitals:   04/27/24 0928  PainSc: 0-No pain         Complications: No notable events documented.

## 2024-04-27 NOTE — Interval H&P Note (Signed)
 History and Physical Interval Note:  04/27/2024 9:50 AM  Tommy Mcbride  has presented today for surgery, with the diagnosis of PROSTATE CANCER, REPAIR UMBILICAL HERNIA.  The various methods of treatment have been discussed with the patient and family. After consideration of risks, benefits and other options for treatment, the patient has consented to  Procedure(s) with comments: PROSTATECTOMY, RADICAL, ROBOT-ASSISTED, LAPAROSCOPIC (N/A) - LEVEL 2 LYMPHADENECTOMY, PELVIS, ROBOT-ASSISTED (Bilateral) REPAIR, HERNIA, UMBILICAL, ADULT (N/A) as a surgical intervention.  The patient's history has been reviewed, patient examined, no change in status, stable for surgery.  I have reviewed the patient's chart and labs.  Questions were answered to the patient's satisfaction.     [employer]

## 2024-04-27 NOTE — Anesthesia Procedure Notes (Signed)
 Procedure Name: Intubation Date/Time: 04/27/2024 10:36 AM  Performed by: Nada Corean CROME, CRNAPre-anesthesia Checklist: Patient identified, Emergency Drugs available, Suction available, Patient being monitored and Timeout performed Patient Re-evaluated:Patient Re-evaluated prior to induction Oxygen Delivery Method: Circle system utilized Preoxygenation: Pre-oxygenation with 100% oxygen Induction Type: IV induction Ventilation: Mask ventilation without difficulty Laryngoscope Size: Mac and 4 Grade View: Grade I Tube type: Oral Tube size: 7.5 mm Number of attempts: 1 Airway Equipment and Method: Stylet Placement Confirmation: ETT inserted through vocal cords under direct vision, positive ETCO2 and breath sounds checked- equal and bilateral Secured at: 23 cm Tube secured with: Tape Dental Injury: Teeth and Oropharynx as per pre-operative assessment

## 2024-04-27 NOTE — Anesthesia Postprocedure Evaluation (Signed)
 Anesthesia Post Note  Patient: Tommy Mcbride  Procedure(s) Performed: PROSTATECTOMY, RADICAL, ROBOT-ASSISTED, LAPAROSCOPIC LYMPHADENECTOMY, PELVIS, ROBOT-ASSISTED (Bilateral) REPAIR, HERNIA, UMBILICAL, ADULT     Patient location during evaluation: PACU Anesthesia Type: General Level of consciousness: awake and alert Pain management: pain level controlled Vital Signs Assessment: post-procedure vital signs reviewed and stable Respiratory status: spontaneous breathing, nonlabored ventilation, respiratory function stable and patient connected to nasal cannula oxygen Cardiovascular status: blood pressure returned to baseline and stable Postop Assessment: no apparent nausea or vomiting Anesthetic complications: no   No notable events documented.  Last Vitals:  Vitals:   04/27/24 1333 04/27/24 1345  BP: 121/74 125/67  Pulse: 93 89  Resp: 12 13  Temp: 36.4 C   SpO2: 99% 100%    Last Pain:  Vitals:   04/27/24 1345  PainSc: 0-No pain                 Nisha Dhami

## 2024-04-28 ENCOUNTER — Encounter (HOSPITAL_COMMUNITY): Payer: Self-pay | Admitting: Urology

## 2024-04-28 DIAGNOSIS — J45909 Unspecified asthma, uncomplicated: Secondary | ICD-10-CM | POA: Diagnosis not present

## 2024-04-28 DIAGNOSIS — C61 Malignant neoplasm of prostate: Secondary | ICD-10-CM | POA: Diagnosis not present

## 2024-04-28 DIAGNOSIS — Z7982 Long term (current) use of aspirin: Secondary | ICD-10-CM | POA: Diagnosis not present

## 2024-04-28 DIAGNOSIS — Z01818 Encounter for other preprocedural examination: Secondary | ICD-10-CM | POA: Diagnosis not present

## 2024-04-28 DIAGNOSIS — K429 Umbilical hernia without obstruction or gangrene: Secondary | ICD-10-CM | POA: Diagnosis not present

## 2024-04-28 DIAGNOSIS — F1090 Alcohol use, unspecified, uncomplicated: Secondary | ICD-10-CM | POA: Diagnosis not present

## 2024-04-28 LAB — HEMOGLOBIN AND HEMATOCRIT, BLOOD
HCT: 40.2 % (ref 39.0–52.0)
Hemoglobin: 13.4 g/dL (ref 13.0–17.0)

## 2024-04-28 MED ORDER — BISACODYL 10 MG RE SUPP
10.0000 mg | Freq: Once | RECTAL | Status: AC
  Administered 2024-04-28: 10 mg via RECTAL
  Filled 2024-04-28: qty 1

## 2024-04-28 MED ORDER — TRAMADOL HCL 50 MG PO TABS: 50.0000 mg | ORAL_TABLET | Freq: Four times a day (QID) | ORAL | Status: DC | PRN

## 2024-04-28 NOTE — Progress Notes (Signed)
   04/28/24 0828  TOC Brief Assessment  Insurance and Status Reviewed  Patient has primary care physician Yes  Home environment has been reviewed Resides in single family home with spouse  Prior level of function: Independent with ADLs at baseline  Prior/Current Home Services No current home services  Social Drivers of Health Review SDOH reviewed no interventions necessary  Readmission risk has been reviewed Yes  Transition of care needs no transition of care needs at this time

## 2024-04-28 NOTE — Progress Notes (Signed)
 DC instructions reviewed with patient and spouse including instruction on use of leg bag.  Questions answered, verbalized understanding. Patient ambulatory to main entrance accompanied by RN to be taken home by wife.

## 2024-04-28 NOTE — Plan of Care (Signed)
  Problem: Education: Goal: Knowledge of the procedure and recovery process will improve Outcome: Progressing   Problem: Bowel/Gastric: Goal: Gastrointestinal status for postoperative course will improve Outcome: Progressing   Problem: Pain Management: Goal: General experience of comfort will improve Outcome: Progressing   Problem: Skin Integrity: Goal: Demonstration of wound healing without infection will improve Outcome: Progressing   Problem: Urinary Elimination: Goal: Ability to avoid or minimize complications of infection will improve Outcome: Progressing Goal: Ability to achieve and maintain urine output will improve Outcome: Progressing Goal: Home care management will improve Outcome: Progressing   Problem: Health Behavior/Discharge Planning: Goal: Ability to manage health-related needs will improve Outcome: Progressing   Problem: Health Behavior/Discharge Planning: Goal: Ability to manage health-related needs will improve Outcome: Progressing   Problem: Activity: Goal: Risk for activity intolerance will decrease Outcome: Progressing

## 2024-04-28 NOTE — Care Management Obs Status (Signed)
 MEDICARE OBSERVATION STATUS NOTIFICATION   Patient Details  Name: TALHA ISER MRN: [id number] Date of Birth: 1956/01/10   Medicare Observation Status Notification Given:  Yes    Duwaine GORMAN Aran, LCSW 04/28/2024, 12:54 PM

## 2024-04-28 NOTE — Discharge Summary (Signed)
  Date of admission: 04/27/2024  Date of discharge: 04/28/2024  Admission diagnosis: Prostate Cancer  Discharge diagnosis: Prostate Cancer  History and Physical: For full details, please see admission history and physical. Briefly, Tommy Mcbride is a 68 y.o. gentleman with localized prostate cancer.  After discussing management/treatment options, he elected to proceed with surgical treatment.  Hospital Course: JEANETTE MOFFATT was taken to the operating room on 04/27/2024 and underwent a robotic assisted laparoscopic radical prostatectomy. He tolerated this procedure well and without complications. Postoperatively, he was able to be transferred to a regular hospital room following recovery from anesthesia.  He was able to begin ambulating the night of surgery. He remained hemodynamically stable overnight.  He had excellent urine output with appropriately minimal output from his pelvic drain and his pelvic drain was removed on POD #1.  He was transitioned to oral pain medication, tolerated a clear liquid diet, and had met all discharge criteria and was able to be discharged home later on POD#1.  Laboratory values:  Recent Labs    04/27/24 1345 04/28/24 0436  HGB 13.7 13.4  HCT 43.0 40.2    Disposition: Home  Discharge instruction: He was instructed to be ambulatory but to refrain from heavy lifting, strenuous activity, or driving. He was instructed on urethral catheter care.  Discharge medications:   Allergies as of 04/28/2024       Reactions   Penicillins Other (See Comments)   childhood reaction        Medication List     STOP taking these medications    aspirin EC 325 MG tablet   B COMPLEX-C PO   HOMEOPATHIC PRODUCTS PO   ibuprofen 200 MG tablet Commonly known as: ADVIL   multivitamin with minerals Tabs tablet   POLICOSANOL PO   RED YEAST RICE PO   SUSTAINABLE VEGAN OMEGA-3 PO   TART CHERRY PO       TAKE these medications    Breyna  80-4.5 MCG/ACT  inhaler Generic drug: budesonide -formoterol  Inhale 2 puffs into the lungs 2 (two) times daily as needed (respiratory issues.).   docusate sodium  100 MG capsule Commonly known as: COLACE Take 1 capsule (100 mg total) by mouth 2 (two) times daily.   sulfamethoxazole -trimethoprim  800-160 MG tablet Commonly known as: BACTRIM  DS Take 1 tablet by mouth 2 (two) times daily. Start the day prior to foley removal appointment   traMADol  50 MG tablet Commonly known as: Ultram  Take 1-2 tablets (50-100 mg total) by mouth every 6 (six) hours as needed for moderate pain (pain score 4-6) or severe pain (pain score 7-10).   Voltaren 1 % Gel Generic drug: diclofenac Sodium Apply 1 Application topically 4 (four) times daily as needed (aches/pains.).        Followup: He will followup in 1 week for catheter removal and to discuss his surgical pathology results.

## 2024-04-28 NOTE — Progress Notes (Signed)
 Patient ID: Tommy Mcbride, male   DOB: 05-30-1956, 68 y.o.   MRN: [id number]  1 Day Post-Op Subjective: The patient is doing well.  No nausea or vomiting. Pain is adequately controlled.  Objective: Vital signs in last 24 hours: Temp:  [97.5 F (36.4 C)-98.3 F (36.8 C)] 97.9 F (36.6 C) (08/15 0430) Pulse Rate:  [69-93] 69 (08/15 0430) Resp:  [9-20] 18 (08/15 0430) BP: (121-138)/(67-79) 132/70 (08/15 0430) SpO2:  [96 %-100 %] 96 % (08/15 0430) Weight:  [78.9 kg] 78.9 kg (08/14 0928)  Intake/Output from previous day: 08/14 0701 - 08/15 0700 In: 2772.8 [P.O.:240; I.V.:2332.8; IV Piggyback:200.1] Out: 2310 [Urine:1900; Drains:130; Blood:280] Intake/Output this shift: No intake/output data recorded.  Physical Exam:  General: Alert and oriented. CV: RRR Lungs: Clear bilaterally. GI: Soft, Nondistended. Incisions: Clean, dry, and intact Urine: Clear Extremities: Nontender, no erythema, no edema.  Lab Results: Recent Labs    04/27/24 1345 04/28/24 0436  HGB 13.7 13.4  HCT 43.0 40.2      Assessment/Plan: POD# 1 s/p robotic prostatectomy.  1) SL IVF 2) Ambulate, Incentive spirometry 3) Transition to oral pain medication 4) Dulcolax suppository 5) D/C pelvic drain 6) Plan for likely discharge later today   Tommy Mcbride. MD   LOS: 0 days   Tommy Mcbride 04/28/2024, 8:02 AM

## 2024-05-02 DIAGNOSIS — C61 Malignant neoplasm of prostate: Secondary | ICD-10-CM | POA: Diagnosis not present

## 2024-05-02 LAB — SURGICAL PATHOLOGY

## 2024-05-25 DIAGNOSIS — N393 Stress incontinence (female) (male): Secondary | ICD-10-CM | POA: Diagnosis not present

## 2024-05-25 DIAGNOSIS — M62838 Other muscle spasm: Secondary | ICD-10-CM | POA: Diagnosis not present

## 2024-05-25 DIAGNOSIS — M6281 Muscle weakness (generalized): Secondary | ICD-10-CM | POA: Diagnosis not present

## 2024-06-07 DIAGNOSIS — M62838 Other muscle spasm: Secondary | ICD-10-CM | POA: Diagnosis not present

## 2024-06-07 DIAGNOSIS — M6281 Muscle weakness (generalized): Secondary | ICD-10-CM | POA: Diagnosis not present

## 2024-06-07 DIAGNOSIS — N393 Stress incontinence (female) (male): Secondary | ICD-10-CM | POA: Diagnosis not present

## 2024-06-29 DIAGNOSIS — M6281 Muscle weakness (generalized): Secondary | ICD-10-CM | POA: Diagnosis not present

## 2024-06-29 DIAGNOSIS — M62838 Other muscle spasm: Secondary | ICD-10-CM | POA: Diagnosis not present

## 2024-06-29 DIAGNOSIS — N393 Stress incontinence (female) (male): Secondary | ICD-10-CM | POA: Diagnosis not present

## 2024-07-06 DIAGNOSIS — H2513 Age-related nuclear cataract, bilateral: Secondary | ICD-10-CM | POA: Diagnosis not present

## 2024-07-28 DIAGNOSIS — C61 Malignant neoplasm of prostate: Secondary | ICD-10-CM | POA: Diagnosis not present

## 2024-08-03 ENCOUNTER — Encounter: Payer: Self-pay | Admitting: Internal Medicine

## 2024-08-03 NOTE — Progress Notes (Addendum)
 REFERRING PROVIDER: Cam Morene ORN, MD [address] [place],  [place] [address]  PRIMARY PROVIDER:  Amon Aloysius BRAVO, MD  PRIMARY REASON FOR VISIT:  1. Prostate cancer (HCC)   2. Family history of breast cancer    HISTORY OF PRESENT ILLNESS:   Pj Zehner, a 68 y.o. male, was seen for a [facility] cancer genetics consultation at the request of Morene Cam, MD due to a personal and family history of prostate and breast cancers.  Jamarkis Branam presents to clinic today to discuss the possibility of a hereditary predisposition to cancer, genetic testing, and to further clarify his future cancer risks, as well as potential cancer risks for family members.   Diagnosis: In August 2025, at the age of 72, Tasean Mancha was diagnosed with prostate cancer. The treatment has included prostatectomy.   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    Pneumonia    Prostate cancer [facility])    Past Surgical History:  Procedure Laterality Date   JOINT REPLACEMENT Left 08/2020   Knee replacement   REPLACEMENT TOTAL KNEE Left    ~08-2020   ROBOT ASSISTED LAPAROSCOPIC RADICAL PROSTATECTOMY N/A 04/27/2024   Procedure: PROSTATECTOMY, RADICAL, ROBOT-ASSISTED, LAPAROSCOPIC;  Surgeon: Renda Glance, MD;  Location: [facility];  Service: Urology;  Laterality: N/A;  LEVEL 2   TONSILLECTOMY     UMBILICAL HERNIA REPAIR N/A 04/27/2024   Procedure: REPAIR, HERNIA, UMBILICAL, ADULT;  Surgeon: Renda Glance, MD;  Location: [facility];   Service: Urology;  Laterality: N/A;   Social History   Socioeconomic History   Marital status: Married    Spouse name: Not on file   Number of children: 2   Years of education: Not on file   Highest education level: Bachelor's degree (e.g., BA, AB, BS)  Occupational History   Occupation: tennis pro  Tobacco Use   Smoking status: Never   Smokeless tobacco: Never  Vaping Use   Vaping status: Never Used  Substance and Sexual Activity   Alcohol use: Yes    Alcohol/week: 4.0 standard drinks of alcohol    Types: 4 Cans of beer per week    Comment: few beers a week   Drug use: Never   Sexual activity: Yes    Birth control/protection: Condom  Other Topics Concern   Not on file  Social History Narrative   Tennis pro, very active.   2 daughters    Shanda  MD   Kathern: grad school psychology    Social Drivers of Health   Financial Resource Strain: Low Risk  (03/10/2024)   Overall Financial Resource Strain (CARDIA)    Difficulty of Paying Living Expenses: Not hard at all  Food Insecurity: No Food Insecurity (04/27/2024)   Hunger Vital Sign    Worried About Running Out of Food in the Last Year: Never true    Ran Out of Food in the Last Year: Never true  Transportation Needs: No Transportation Needs (04/27/2024)  PRAPARE - Administrator, Civil Service (Medical): No    Lack of Transportation (Non-Medical): No  Physical Activity: Sufficiently Active (03/10/2024)   Exercise Vital Sign    Days of Exercise per Week: 3 days    Minutes of Exercise per Session: 120 min  Stress: Stress Concern Present (03/10/2024)   [employer] of Occupational Health - Occupational Stress Questionnaire    Feeling of Stress: To some extent  Social Connections: Moderately Isolated (04/27/2024)   Social Connection and Isolation Panel    Frequency of Communication with Friends and Family: Once a week    Frequency of Social Gatherings with Friends and Family: More than three times a week     Attends Religious Services: Never    Database Administrator or Organizations: No    Attends Engineer, Structural: Never    Marital Status: Married     FAMILY HISTORY:  We obtained a detailed, 4-generation family history pasted below.   Prentiss Polio is unaware of relatives completing genetic testing for hereditary cancer risks.  There is no reported Ashkenazi Jewish ancestry.  There is no known consanguinity.  Significant diagnoses are listed below: Family History  Problem Relation Age of Onset   Breast cancer Mother 56   Arthritis Mother    Cancer Mother    Stroke Father 63   Hyperlipidemia Father    Breast cancer Maternal Aunt 70   CAD Neg Hx    Diabetes Neg Hx    Colon cancer Neg Hx    Prostate cancer Neg Hx    Pedigree Summary Mother - Breast Cancer dx. 62 Maternal Aunt Breast Cancer dx. 62   GENETIC COUNSELING ASSESSMENT: Donavin Audino is a 68 y.o. male with a personal history of prostate cancer and family history breast cancer which is somewhat suggestive of a hereditary cancer predisposition syndrome. We, therefore, discussed and recommended the following at today's visit.   DISCUSSION: We discussed that, in general, most cancer is not inherited in families, but instead is sporadic or familial. Sporadic cancers occur by chance and typically happen at older ages (>50 years) as this type of cancer is caused by genetic changes acquired during an individual's lifetime. Some families have more cancers than would be expected by chance; however, the ages or types of cancer are not consistent with a known genetic mutation or known genetic mutations have been ruled out. This type of familial cancer is thought to be due to a combination of multiple genetic, environmental, hormonal, and lifestyle factors. While this combination of factors likely increases the risk of cancer, the exact source of this risk is not currently identifiable or testable.  We discussed that 5-10% of  cancer is the result of germline (heritable) genetic variants, with most cases associated with BRCA1/BRCA2. There are other genes that can be associated with hereditary cancer syndromes. We discussed that testing is beneficial for several reasons including knowing how to follow individuals after completing their treatment, identifying whether potential treatment options such as PARP inhibitors would be beneficial, and understanding if other family members could be at risk for cancer and allow them to undergo genetic testing.   We reviewed the characteristics, features and inheritance patterns of hereditary cancer syndromes. We also discussed genetic testing, including the appropriate family members to test, the process of testing, insurance coverage and turn-around-time for results. We discussed the implications of a negative, positive, carrier and/or variant of uncertain significant result. Seyed Heffley  was offered a common hereditary  cancer panel (40 genes) and an expanded pan-cancer panel (77 genes). Alexx Mcburney was informed of the benefits and limitations of each panel, including that expanded pan-cancer panels contain genes that do not have clear management guidelines at this point in time.  We also discussed that as the number of genes included on a panel increases, the chances of variants of uncertain significance increases.  GENETIC TESTING NATIONAL CRITERIA: Based on Lum Stillinger personal history of prostate cancer and family history breast cancer of cancer he meets medical criteria for genetic testing based on the [employer] (NCCN) guidelines.  Despite that he meets criteria, he  may still have an out of pocket cost. We discussed that if his out of pocket cost for testing is over $100, the laboratory will call and confirm whether he wants to proceed with testing.  If the out of pocket cost of testing is less than $100 he will be billed by the genetic testing  laboratory.  We also reviewed that the lab has a $250 self pay price which he can also opt into if his out-of pocket cost is greater than $250. [facility] Patient Assistance was not completed during the visit   GENETIC TESTING CONSENT:  After considering the risks, benefits, and limitations, Tonatiuh Mallon provided informed consent to pursue genetic testing. A blood sample was sent to [facility] for analysis of the CancerNext-Expanded+RNA Panel. Results should be available within approximately 2-3 weeks' time, at which point they will be disclosed by telephone to [facility] , as will any additional recommendations warranted by these results. Elspeth Fam will receive a summary of her genetic counseling visit and a copy of his results once available. This information will also be available in Epic.  [facility] CancerNext-Expanded + RNAinsight gene panel which includes sequencing, rearrangement, and RNA analysis for the following 77 genes: AIP, ALK, APC, ATM, AXIN2, BAP1, BARD1, BMPR1A, BRCA1, BRCA2, BRIP1, CDC73, CDH1, CDK4, CDKN1B, CDKN2A, CEBPA, CHEK2, CTNNA1, DDX41, DICER1, ETV6, FH, FLCN, GATA2, LZTR1, MAX, MBD4, MEN1, MET, MLH1, MSH2, MSH3, MSH6, MUTYH, NF1, NF2, NTHL1, PALB2, PHOX2B, PMS2, POT1, PRKAR1A, PTCH1, PTEN, RAD51C, RAD51D, RB1, RET, RPS20, RUNX1, SDHA, SDHAF2, SDHB, SDHC, SDHD, SMAD4, SMARCA4, SMARCB1, SMARCE1, STK11, SUFU, TMEM127, TP53, TSC1, TSC2, VHL, and WT1 (sequencing and deletion/duplication); EGFR, HOXB13, KIT, MITF, PDGFRA, POLD1, and POLE (sequencing only); EPCAM and GREM1 (deletion/duplication only).   GENETIC INFORMATION NONDISCRIMINATION ACT (GINA): We discussed that some people do not want to undergo genetic testing due to fear of genetic discrimination.  The Genetic Information Nondiscrimination Act (GINA) was signed into federal law in 2008. GINA prohibits health insurers and most employers from discriminating against individuals based on genetic information  (including the results of genetic tests and family history information). According to GINA, health insurance companies cannot consider genetic information to be a preexisting condition, nor can they use it to make decisions regarding coverage or rates. GINA also makes it illegal for most employers to use genetic information in making decisions about hiring, firing, promotion, or terms of employment. It is important to note that GINA does not offer protections for life insurance, disability insurance, or long-term care insurance. GINA does not apply to those in the [employer], those who work for companies with less than 15 employees, and new life insurance or long-term disability insurance policies.  Health status due to a cancer diagnosis is not protected under GINA. More information about GINA can be found by visiting eliteclients.be.  Lastly, we encouraged Elspeth Fam to remain  in contact with cancer genetics annually so that we can continuously update the family history and inform him of any changes in cancer genetics and testing that may be of benefit for this family.   Binnie Domanski's questions were answered to his satisfaction today. Our contact information was provided should additional questions or concerns arise. Thank you for the referral and allowing us  to share in the care of your patient.   Resources:  Akeen Ledyard was provided with the following:  [employer] Hereditary Cancer Testing Patient Guide [facility] CancerNext-Expanded + RNAinsight gene list  PLAN:  Testing Ordered: [facility] CancerNext-Expanded + RNAinsight  Clinic Note Faxed/Routed to [facility] PCP Amon Aloysius BRAVO, MD   Santana Fryer, MS, [facility]  Certified Genetic Counselor  Email: [email or web address]  Phone: [phone]  I personally spent a total of 60 minutes in the care of the patient today including preparing to see the patient, counseling and educating, placing  orders, referring and communicating with other health care professionals, and documenting clinical information in the EHR.  The patient was seen alone. Drs. Lanny Stalls, and/or Gudena were available for questions, if needed. _______________________________________________________________________ For Office Staff:  Number of people involved in session: 1 Was an Intern/ student involved with case: no

## 2024-08-04 ENCOUNTER — Other Ambulatory Visit: Payer: Self-pay

## 2024-08-04 ENCOUNTER — Inpatient Hospital Stay

## 2024-08-04 DIAGNOSIS — C61 Malignant neoplasm of prostate: Secondary | ICD-10-CM

## 2024-08-04 DIAGNOSIS — Z803 Family history of malignant neoplasm of breast: Secondary | ICD-10-CM

## 2024-08-04 DIAGNOSIS — N5201 Erectile dysfunction due to arterial insufficiency: Secondary | ICD-10-CM | POA: Diagnosis not present

## 2024-08-04 LAB — GENETIC SCREENING ORDER

## 2024-08-07 ENCOUNTER — Ambulatory Visit: Payer: Self-pay

## 2024-08-07 NOTE — Telephone Encounter (Signed)
 Appt scheduled

## 2024-08-07 NOTE — Telephone Encounter (Signed)
 FYI Only or Action Required?: FYI only for provider: appointment scheduled on 08/08/2024 at 4 PM.  Patient was last seen in primary care on 09/24/2023 by Amon Aloysius BRAVO, MD.  Called Nurse Triage reporting Dysphagia and Mass (Patient thinks he has a hernia. ).  Symptoms began 3 months ago.  Interventions attempted: Rest, hydration, or home remedies.  Symptoms are: unchanged.  Triage Disposition: See Physician Within 24 Hours  Patient/caregiver understands and will follow disposition?: Yes  Copied from CRM #[id number]. Topic: Clinical - Red Word Triage >> Aug 07, 2024  8:27 AM Carlyon D wrote: Red Word that prompted transfer to Nurse Triage: pt believed he has a hernia in middle of the chest. Starting to impact swallowing and breathing pt is concerned., Reason for Disposition  [1] Swallowing difficulty AND [2] cause unknown  (Exception: Difficulty swallowing is a chronic symptom.)  Can't reduce the hernia  (Note: NO pain, tenderness of hernia, or vomiting)  Answer Assessment - Initial Assessment Questions Patient reports that he thinks he has a hernia to upper abdominal area and this is causing swallowing difficulty.   1. DESCRIPTION: Tell me more about this problem. Are you  having trouble swallowing liquids, solids, or both? Any trouble with swallowing saliva (spit)?     Trouble swallowing liquids and solids  2. SEVERITY: How bad is the swallowing difficulty?  (Scale 1-10; or mild, moderate, severe)     Moderate  3. ONSET: When did the swallowing problems begin?      Gotten progressively worse over the last couple of months 4. CAUSE: What do you think is causing the problem?  (e.g., dry mouth, food or pill stuck in throat, mouth pain, sore throat, progression of disease process such as dementia or Parkinson's disease).      unsure 5. CHRONIC or RECURRENT: Is this a new problem for you?  If No, ask: How long have you had this problem? (e.g., days, weeks, months)      yes 6.  OTHER SYMPTOMS: Do you have any other symptoms? (e.g., chest pain, difficulty breathing, mouth sores, sore throat, swollen tongue, chest pain)     Difficulty breathing at times-tightness in chest.  Answer Assessment - Initial Assessment Questions 1. ONSET:  When did this first appear?     Almost a year ago per patient 2. APPEARANCE: What does it look like?     Patient states he can't see it but feels it 3. SIZE: How big is it? (e.g., inches, cm; or compare to coins, fruit)     unknown 4. LOCATION: Where exactly is the hernia located?     Below rib cage 5. PATTERN: Does the swelling come and go, or has it been constant since it started?     constant 6. PAIN: Is there any pain? If Yes, ask: How bad is it?  (Scale 0-10; or none, mild, moderate, severe)     No pain per patient 7. DIAGNOSIS: Have you been seen by a doctor (or NP/PA) for this? Did the doctor diagnose you as having a hernia?     no 8. OTHER SYMPTOMS: Do you have any other symptoms? (e.g., fever, abdomen pain, vomiting)     no  Protocols used: Swallowing Difficulty-A-AH, Hernia-A-AH

## 2024-08-08 ENCOUNTER — Ambulatory Visit: Admitting: Family

## 2024-08-08 VITALS — BP 126/75 | HR 88 | Temp 98.3°F | Resp 16 | Ht 72.0 in | Wt 181.0 lb

## 2024-08-08 DIAGNOSIS — R1310 Dysphagia, unspecified: Secondary | ICD-10-CM | POA: Diagnosis not present

## 2024-08-08 MED ORDER — OMEPRAZOLE 40 MG PO CPDR: 40.0000 mg | DELAYED_RELEASE_CAPSULE | Freq: Every day | ORAL | 0 refills | Status: DC

## 2024-08-08 NOTE — Assessment & Plan Note (Addendum)
 New. Chronic dysphagia with suspected esophageal stricture, possibly due to chronic acid reflux. Differential includes structural abnormalities. - Prescribe omeprazole  once daily until GI consultation. - Referred to GI for consultation and potential endoscopy dilation. - Advised ER visit if food impaction occurs and does not resolve spontaneously.

## 2024-08-08 NOTE — Patient Instructions (Signed)
  VISIT SUMMARY: You came in today because you have been experiencing swallowing difficulties for the past five years, which have recently become constant. You described a sensation of food feeling 'stuck' and hiccups when eating too quickly. You have not experienced significant weight loss or heartburn, and you maintain a healthy diet. You recently had a prostatectomy and wondered if it could be related to your symptoms.  YOUR PLAN: -DYSPHAGIA: Dysphagia means difficulty swallowing. It can be caused by various issues, including structural abnormalities or chronic acid reflux. You have been prescribed omeprazole  to take once daily until you see a gastrointestinal (GI) specialist. You have been referred to a GI specialist for a consultation and possible endoscopy with balloon dilation to investigate and potentially treat the issue. If you experience food getting stuck and it does not resolve, you should go to the emergency room.  INSTRUCTIONS: Please take omeprazole  once daily as prescribed. Follow up with the GI specialist for your consultation and potential endoscopy. If you experience food impaction that does not resolve, visit the emergency room immediately.

## 2024-08-08 NOTE — Progress Notes (Signed)
 Subjective:     Patient ID: Tommy Mcbride, male    DOB: 21-Aug-1956, 68 y.o.   MRN: [id number]  Chief Complaint  Patient presents with   Dysphagia    Patient reports he is having trouble swallowing for about 2 years. Getting worst     HPI  Discussed the use of AI scribe software for clinical note transcription with the patient, who gave verbal consent to proceed.  History of Present Illness Tommy Mcbride is a 68 year old male who presents with swallowing difficulties.  He has experienced swallowing discomfort for the past five years, which has recently become constant. The sensation is described as food feeling 'stuck' and is accompanied by hiccups when eating too quickly. Initially, thorough chewing alleviated the issue, but now there is a persistent sensation of obstruction from the throat to a lump in the chest.  No significant weight loss, maintaining the same weight for the past 37 years. No heartburn is present, and he states the sensation differs from heartburn. He consumes a healthy, bland diet, avoiding spicy foods. Despite a lack of hunger, he is able to eat when he chooses.       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    Pneumonia    Prostate cancer [facility])     Past Surgical History:  Procedure Laterality Date   JOINT REPLACEMENT Left 08/2020   Knee replacement   REPLACEMENT TOTAL KNEE Left    ~08-2020   ROBOT ASSISTED LAPAROSCOPIC RADICAL PROSTATECTOMY N/A 04/27/2024   Procedure: PROSTATECTOMY, RADICAL, ROBOT-ASSISTED, LAPAROSCOPIC;  Surgeon: Renda Glance, MD;  Location: [facility];  Service: Urology;  Laterality: N/A;  LEVEL 2   TONSILLECTOMY      UMBILICAL HERNIA REPAIR N/A 04/27/2024   Procedure: REPAIR, HERNIA, UMBILICAL, ADULT;  Surgeon: Renda Glance, MD;  Location: [facility];  Service: Urology;  Laterality: N/A;    Family History  Problem Relation Age of Onset   Breast cancer Mother 71   Arthritis Mother    Cancer Mother    Stroke Father 33   Hyperlipidemia Father    Breast cancer Maternal Aunt 44   CAD Neg Hx    Diabetes Neg Hx    Colon cancer Neg Hx    Prostate cancer Neg Hx     Social History   Socioeconomic History   Marital status: Married    Spouse name: Not on file   Number of children: 2   Years of education: Not on file   Highest education level: Bachelor's degree (e.g., BA, AB, BS)  Occupational History   Occupation: tennis pro  Tobacco Use   Smoking status: Never   Smokeless tobacco: Never  Vaping Use   Vaping status: Never Used  Substance and Sexual Activity   Alcohol use: Yes    Alcohol/week: 4.0 standard drinks of alcohol    Types: 4 Cans of beer per week    Comment: few beers a week   Drug use: Never   Sexual activity: Yes    Birth control/protection: Condom  Other Topics Concern   Not on file  Social  History Narrative   Tennis pro, very active.   2 daughters    Shanda  MD   Kathern: grad school psychology    Social Drivers of Health   Financial Resource Strain: Low Risk  (08/07/2024)   Overall Financial Resource Strain (CARDIA)    Difficulty of Paying Living Expenses: Not hard at all  Food Insecurity: No Food Insecurity (08/07/2024)   Hunger Vital Sign    Worried About Running Out of Food in the Last Year: Never true    Ran Out of Food in the Last Year: Never true  Transportation Needs: No Transportation Needs (08/07/2024)   PRAPARE - Administrator, Civil Service (Medical): No    Lack of Transportation (Non-Medical): No  Physical Activity: Sufficiently Active (08/07/2024)   Exercise Vital Sign    Days of Exercise per Week: 5 days    Minutes of Exercise per  Session: 60 min  Stress: No Stress Concern Present (08/07/2024)   [employer] of Occupational Health - Occupational Stress Questionnaire    Feeling of Stress: Not at all  Social Connections: Moderately Integrated (08/07/2024)   Social Connection and Isolation Panel    Frequency of Communication with Friends and Family: Twice a week    Frequency of Social Gatherings with Friends and Family: More than three times a week    Attends Religious Services: Never    Database Administrator or Organizations: Yes    Attends Banker Meetings: Patient declined    Marital Status: Married  Catering Manager Violence: Not At Risk (04/27/2024)   Humiliation, Afraid, Rape, and Kick questionnaire    Fear of Current or Ex-Partner: No    Emotionally Abused: No    Physically Abused: No    Sexually Abused: No    Outpatient Medications Prior to Visit  Medication Sig Dispense Refill   budesonide -formoterol  (BREYNA ) 80-4.5 MCG/ACT inhaler Inhale 2 puffs into the lungs 2 (two) times daily as needed (respiratory issues.).     diclofenac Sodium (VOLTAREN) 1 % GEL Apply 1 Application topically 4 (four) times daily as needed (aches/pains.).     Multiple Vitamin (MULTIVITAMIN WITH MINERALS) TABS tablet Take 1 tablet by mouth daily.     Omega-3 Fatty Acids (SUSTAINABLE VEGAN OMEGA-3 PO) Take by mouth.     Policosanol 30 MG TABS Policosanol     POLICOSANOL PO Take by mouth.     Tart Cherry (TART CHERRY ULTRA) 1200 MG CAPS Take by mouth.     docusate sodium  (COLACE) 100 MG capsule Take 1 capsule (100 mg total) by mouth 2 (two) times daily.     sulfamethoxazole -trimethoprim  (BACTRIM  DS) 800-160 MG tablet Take 1 tablet by mouth 2 (two) times daily. Start the day prior to foley removal appointment 6 tablet 0   traMADol  (ULTRAM ) 50 MG tablet Take 1-2 tablets (50-100 mg total) by mouth every 6 (six) hours as needed for moderate pain (pain score 4-6) or severe pain (pain score 7-10). 20 tablet 0   No  facility-administered medications prior to visit.    Allergies  Allergen Reactions   Penicillins Other (See Comments)    childhood reaction    ROS    See HPI  Objective:    Physical Exam Constitutional:      General: He is not in acute distress.    Appearance: He is well-developed.  HENT:     Head: Normocephalic and atraumatic.  Cardiovascular:     Rate and Rhythm: Normal rate and regular rhythm.  Heart sounds: No murmur heard. Pulmonary:     Effort: Pulmonary effort is normal. No respiratory distress.     Breath sounds: Normal breath sounds. No wheezing or rales.  Abdominal:     General: Bowel sounds are normal.     Palpations: Abdomen is soft.     Tenderness: There is no abdominal tenderness.     Comments: Bony nodular prominence at the base of his sternum  Skin:    General: Skin is warm and dry.  Neurological:     Mental Status: He is alert and oriented to person, place, and time.  Psychiatric:        Behavior: Behavior normal.        Thought Content: Thought content normal.      BP 126/75 (BP Location: Right Arm, Patient Position: Sitting, Cuff Size: Normal)   Pulse 88   Temp 98.3 F (36.8 C) (Oral)   Resp 16   Ht 6' (1.829 m)   Wt 181 lb (82.1 kg)   SpO2 97%   BMI 24.55 kg/m  Wt Readings from Last 3 Encounters:  08/08/24 181 lb (82.1 kg)  04/27/24 174 lb (78.9 kg)  04/21/24 174 lb (78.9 kg)       Assessment & Plan:   Problem List Items Addressed This Visit       Unprioritized   Dysphagia - Primary    Chronic dysphagia with suspected esophageal stricture, possibly due to chronic acid reflux. Differential includes structural abnormalities. - Prescribe omeprazole  once daily until GI consultation. - Referred to GI for consultation and potential endoscopy dilation. - Advised ER visit if food impaction occurs and does not resolve spontaneously.        Relevant Medications   omeprazole  (PRILOSEC) 40 MG capsule   Other Relevant Orders    Ambulatory referral to Gastroenterology   Assessment & Plan     I have discontinued Elspeth L. Salek's docusate sodium , sulfamethoxazole -trimethoprim , and traMADol . I am also having him start on omeprazole . Additionally, I am having him maintain his budesonide -formoterol , diclofenac Sodium, Policosanol, Omega-3 Fatty Acids (SUSTAINABLE VEGAN OMEGA-3 PO), multivitamin with minerals, POLICOSANOL PO, and Tart Cherry Ultra.  Meds ordered this encounter  Medications   omeprazole  (PRILOSEC) 40 MG capsule    Sig: Take 1 capsule (40 mg total) by mouth daily.    Dispense:  90 capsule    Refill:  0    Supervising Provider:   DOMENICA BLACKBIRD A [[id number]]

## 2024-08-16 ENCOUNTER — Telehealth: Payer: Self-pay

## 2024-08-16 DIAGNOSIS — Z1379 Encounter for other screening for genetic and chromosomal anomalies: Secondary | ICD-10-CM | POA: Insufficient documentation

## 2024-08-16 NOTE — Telephone Encounter (Signed)
 I contacted  Tommy Mcbride to discuss his genetic testing results. No pathogenic variants were identified in the 77 genes analyzed. Discussed that we do not know why he has cancer or why there is cancer in the family. It could be due to a different gene that we are not testing, or maybe our current technology may not be able to pick something up.  It will be important for him to keep in contact with genetics to keep up with whether additional testing may be needed.Detailed clinic note to follow.   The test report will be scanned into EPIC and will be located under the Molecular Pathology section of the Results Review tab.  A portion of the result report is included below for reference.

## 2024-08-17 ENCOUNTER — Ambulatory Visit: Payer: Self-pay | Admitting: Licensed Clinical Social Worker

## 2024-08-17 DIAGNOSIS — Z1379 Encounter for other screening for genetic and chromosomal anomalies: Secondary | ICD-10-CM

## 2024-08-17 NOTE — Progress Notes (Signed)
 HPI:   [name] was previously seen in the [facility] due to a personal and family history of cancer and concerns regarding a hereditary predisposition to cancer. Please refer to our prior cancer genetics clinic note for more information regarding our discussion, assessment and recommendations, at the time. [name] recent genetic test results were disclosed to him, as were recommendations warranted by these results. These results and recommendations are discussed in more detail below.  CANCER HISTORY:  Oncology History  Prostate cancer (HCC)  04/27/2024 Initial Diagnosis   Prostate cancer (HCC)   08/13/2024 Genetic Testing   Negative genetics testing. Report date is 08/13/2024.  The CancerNext-Expanded gene panel offered by [facility] and includes sequencing, rearrangement, and RNA analysis for the following 77 genes: AIP, ALK, APC, ATM, BAP1, BARD1, BMPR1A, BRCA1, BRCA2, BRIP1, CDC73, CDH1, CDK4, CDKN1B, CDKN2A, CEBPA, CHEK2, CTNNA1, DDX41, DICER1, ETV6, FH, FLCN, GATA2, LZTR1, MAX, MBD4, MEN1, MET, MLH1, MSH2, MSH3, MSH6, MUTYH, NF1, NF2, NTHL1, PALB2, PHOX2B, PMS2, POT1, PRKAR1A, PTCH1, PTEN, RAD51C, RAD51D, RB1, RET, RPS20, RUNX1, SDHA, SDHAF2, SDHB, SDHC, SDHD, SMAD4, SMARCA4, SMARCB1, SMARCE1, STK11, SUFU, TMEM127, TP53, TSC1, TSC2, VHL, and WT1 (sequencing and deletion/duplication); AXIN2, CTNNA1, DDX41, EGFR, HOXB13, KIT, MBD4, MITF, MSH3, PDGFRA, POLD1 and POLE (sequencing only); EPCAM and GREM1 (deletion/duplication only). RNA data is routinely analyzed for use in variant interpretation for all genes.     FAMILY HISTORY:  We obtained a detailed, 4-generation family history.  Significant diagnoses are listed below: Family History  Problem Relation Age of Onset   Breast cancer Mother 55   Arthritis Mother    Cancer Mother    Stroke Father 42   Hyperlipidemia Father    Breast cancer Maternal Aunt 28   CAD Neg Hx    Diabetes Neg Hx    Colon cancer  Neg Hx    Prostate cancer Neg Hx    Pedigree Summary Mother - Breast Cancer dx. 62 Maternal Aunt Breast Cancer dx. 62    GENETIC TEST RESULTS:  The [facility] CancerNext-Expanded+RNA Panel found no pathogenic mutations.   The CancerNext-Expanded gene panel offered by [facility] and includes sequencing, rearrangement, and RNA analysis for the following 77 genes: AIP, ALK, APC, ATM, AXIN2, BAP1, BARD1, BMPR1A, BRCA1, BRCA2, BRIP1, CDC73, CDH1, CDK4, CDKN1B, CDKN2A, CEBPA, CHEK2, CTNNA1, DDX41, DICER1, ETV6, FH, FLCN, GATA2, LZTR1, MAX, MBD4, MEN1, MET, MLH1, MSH2, MSH3, MSH6, MUTYH, NF1, NF2, NTHL1, PALB2, PHOX2B, PMS2, POT1, PRKAR1A, PTCH1, PTEN, RAD51C, RAD51D, RB1, RET, RPS20, RUNX1, SDHA, SDHAF2, SDHB, SDHC, SDHD, SMAD4, SMARCA4, SMARCB1, SMARCE1, STK11, SUFU, TMEM127, TP53, TSC1, TSC2, VHL, and WT1 (sequencing and deletion/duplication); EGFR, HOXB13, KIT, MITF, PDGFRA, POLD1, and POLE (sequencing only); EPCAM and GREM1 (deletion/duplication only).   The test report has been scanned into EPIC and is located under the Molecular Pathology section of the Results Review tab.  A portion of the result report is included below for reference. Genetic testing reported out on 08/13/2024.      Even though a pathogenic variant was not identified, possible explanations for the cancer in the family may include: There may be no hereditary risk for cancer in the family. The cancers in [name] and/or his family may be sporadic/familial or due to other genetic and environmental factors. There may be a gene mutation in one of these genes that current testing methods cannot detect but that chance is small. There could be another gene that has not yet been discovered, or that we have not yet tested,  that is responsible for the cancer diagnoses in the family.  It is also possible there is a hereditary cause for the cancer in the family that [name] did not inherit  Therefore, it is important to remain in  touch with cancer genetics in the future so that we can continue to offer [name] the most up to date genetic testing.   ADDITIONAL GENETIC TESTING:  We discussed with [name] that his genetic testing was fairly extensive.  If there are additional relevant genes identified to increase cancer risk that can be analyzed in the future, we would be happy to discuss and coordinate this testing at that time.    CANCER SCREENING RECOMMENDATIONS:  [name] test result is considered negative (normal).  This means that we have not identified a hereditary cause for his personal and family history of cancer at this time.   An individual's cancer risk and medical management are not determined by genetic test results alone. Overall cancer risk assessment incorporates additional factors, including personal medical history, family history, and any available genetic information that may result in a personalized plan for cancer prevention and surveillance. Therefore, it is recommended he continue to follow the cancer management and screening guidelines provided by his oncology and primary healthcare provider.  RECOMMENDATIONS FOR FAMILY MEMBERS:   Since he did not inherit a identifiable mutation in a cancer predisposition gene included on this panel, his children could not have inherited a known mutation from him in one of these genes. Individuals in this family might be at some increased risk of developing cancer, over the general population risk, due to the family history of cancer.  Individuals in the family should notify their providers of the family history of cancer. We recommend women in this family have a yearly mammogram beginning at age 59, or 60 years younger than the earliest onset of cancer, an annual clinical breast exam, and perform monthly breast self-exams.  Family members should have colonoscopies by at age 59, or earlier, as recommended by their providers.  FOLLOW-UP:  Lastly, we  discussed with [name] that cancer genetics is a rapidly advancing field and it is possible that new genetic tests will be appropriate for him and/or his family members in the future. We encouraged him to remain in contact with cancer genetics on an annual basis so we can update his personal and family histories and let him know of advances in cancer genetics that may benefit this family.   Our contact number was provided. [name] questions were answered to his satisfaction, and he knows he is welcome to call us  at anytime with additional questions or concerns.    Dena Cary, MS, [facility] Genetic Counselor [email or web address] Phone: [phone]

## 2024-08-24 DIAGNOSIS — K080 Exfoliation of teeth due to systemic causes: Secondary | ICD-10-CM | POA: Diagnosis not present

## 2024-08-28 ENCOUNTER — Encounter: Payer: Self-pay | Admitting: Physician Assistant

## 2024-09-21 ENCOUNTER — Encounter: Payer: Self-pay | Admitting: Gastroenterology

## 2024-09-21 ENCOUNTER — Ambulatory Visit: Admitting: Gastroenterology

## 2024-09-21 ENCOUNTER — Encounter: Payer: Self-pay | Admitting: Internal Medicine

## 2024-09-21 VITALS — BP 136/78 | HR 80 | Ht 72.0 in | Wt 177.0 lb

## 2024-09-21 DIAGNOSIS — R1013 Epigastric pain: Secondary | ICD-10-CM | POA: Diagnosis not present

## 2024-09-21 DIAGNOSIS — Z8546 Personal history of malignant neoplasm of prostate: Secondary | ICD-10-CM | POA: Diagnosis not present

## 2024-09-21 DIAGNOSIS — R1310 Dysphagia, unspecified: Secondary | ICD-10-CM | POA: Diagnosis not present

## 2024-09-21 DIAGNOSIS — F1090 Alcohol use, unspecified, uncomplicated: Secondary | ICD-10-CM

## 2024-09-21 DIAGNOSIS — Z1211 Encounter for screening for malignant neoplasm of colon: Secondary | ICD-10-CM

## 2024-09-21 DIAGNOSIS — C61 Malignant neoplasm of prostate: Secondary | ICD-10-CM

## 2024-09-21 NOTE — Progress Notes (Signed)
 "  Chief Complaint: Dysphagia Primary GI MD: Sampson  HPI: Discussed the use of AI scribe software for clinical note transcription with the patient, who gave verbal consent to proceed.  History of Present Illness   Tommy Mcbride is a 69 year old male who presents with progressive dysphagia and esophageal discomfort.  He describes a lifelong pattern of rare episodes of food getting stuck when eating too quickly, occurring once or twice a year, but since prostatectomy on April 27, 2024, there has been a marked increase in both frequency and severity of swallowing difficulty. Approximately eight weeks after surgery, progressive trouble swallowing both solids and liquids developed, with symptoms worsening over the past 2-3 months.  Meals now require about an hour to finish, especially breakfast and dinner. He avoids toast and prefers soft foods such as eggs, mashed potatoes, and fish. Lunch is generally better tolerated, but still requires frequent pauses and reheating. Ice cream is uniquely well tolerated, while other cold foods like yogurt and juices do not alleviate symptoms. Food temperature does not affect symptoms except for ice cream.  He experiences a sensation of food or liquid getting stuck in the chest and lower esophagus, with constant tightness and discomfort in these areas, even at rest. The sensation is described as it just closes, making swallowing extremely difficult. Occasionally, a release occurs, allowing food to pass and providing brief relief. Discomfort can be severe enough to bring tears to his eyes. Associated upper abdominal discomfort and gas are present, but he denies nausea and vomiting. He is concerned about inadequate fluid intake and his ability to eat and drink sufficiently.  Current medications include omeprazole  40 mg in the afternoon, usually after eating. Ibuprofen was previously tried for possible muscle relaxation, providing minimal relief but worsening  incontinence, and was discontinued two days ago. Stretching exercises and positional maneuvers, such as lying on a ball and stretching his chest and back, help with tightness but do not resolve swallowing difficulty.  He has never had an endoscopy. Colonoscopy 2010 were normal. He does not smoke and previously drank alcohol socially, but stopped two months prior to prostatectomy and has not resumed. There is no family history of colon, esophagus, stomach, or pancreatic cancer.   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    Pneumonia    Prostate cancer [facility])     Past Surgical History:  Procedure Laterality Date   JOINT REPLACEMENT Left 08/2020   Knee replacement   REPLACEMENT TOTAL KNEE Left    ~08-2020   ROBOT ASSISTED LAPAROSCOPIC RADICAL PROSTATECTOMY N/A 04/27/2024   Procedure: PROSTATECTOMY, RADICAL, ROBOT-ASSISTED, LAPAROSCOPIC;  Surgeon: Renda Glance, MD;  Location: [facility];  Service: Urology;  Laterality: N/A;  LEVEL 2   TONSILLECTOMY     UMBILICAL HERNIA REPAIR N/A 04/27/2024   Procedure: REPAIR, HERNIA, UMBILICAL, ADULT;  Surgeon: Renda Glance, MD;  Location: [facility];  Service: Urology;  Laterality: N/A;    Current Outpatient Medications  Medication Sig Dispense Refill   budesonide -formoterol  (BREYNA ) 80-4.5 MCG/ACT inhaler Inhale 2 puffs into the lungs 2 (two) times daily as needed (respiratory issues.).  diclofenac Sodium (VOLTAREN) 1 % GEL Apply 1 Application topically 4 (four) times daily as needed (aches/pains.).     Multiple Vitamin (MULTIVITAMIN WITH MINERALS) TABS tablet Take 1 tablet by mouth daily.     Omega-3 Fatty Acids (SUSTAINABLE VEGAN OMEGA-3  PO) Take by mouth.     omeprazole  (PRILOSEC) 40 MG capsule Take 1 capsule (40 mg total) by mouth daily. 90 capsule 0   Policosanol 30 MG TABS Policosanol     POLICOSANOL PO Take by mouth.     Tart Cherry (TART CHERRY ULTRA) 1200 MG CAPS Take by mouth.     No current facility-administered medications for this visit.    Allergies as of 09/21/2024 - Review Complete 09/21/2024  Allergen Reaction Noted   Penicillins Other (See Comments) 09/24/2014    Family History  Problem Relation Age of Onset   Breast cancer Mother 63   Arthritis Mother    Cancer Mother    Stroke Father 66   Hyperlipidemia Father    Breast cancer Maternal Aunt 72   CAD Neg Hx    Diabetes Neg Hx    Colon cancer Neg Hx    Prostate cancer Neg Hx     Social History   Socioeconomic History   Marital status: Married    Spouse name: Not on file   Number of children: 2   Years of education: Not on file   Highest education level: Bachelor's degree (e.g., BA, AB, BS)  Occupational History   Occupation: tennis pro  Tobacco Use   Smoking status: Never   Smokeless tobacco: Never  Vaping Use   Vaping status: Never Used  Substance and Sexual Activity   Alcohol use: Yes    Alcohol/week: 4.0 standard drinks of alcohol    Types: 4 Cans of beer per week    Comment: few beers a week   Drug use: Never   Sexual activity: Yes    Birth control/protection: Condom  Other Topics Concern   Not on file  Social History Narrative   Tennis pro, very active.   2 daughters    Shanda  MD   Kathern: grad school psychology    Social Drivers of Health   Tobacco Use: Low Risk (09/21/2024)   Patient History    Smoking Tobacco Use: Never    Smokeless Tobacco Use: Never    Passive Exposure: Not on file  Financial Resource Strain: Low Risk (08/07/2024)   Overall Financial Resource Strain (CARDIA)    Difficulty of Paying Living Expenses: Not hard at all  Food Insecurity: No Food Insecurity (08/07/2024)   Epic    Worried About  Programme Researcher, Broadcasting/film/video in the Last Year: Never true    Ran Out of Food in the Last Year: Never true  Transportation Needs: No Transportation Needs (08/07/2024)   Epic    Lack of Transportation (Medical): No    Lack of Transportation (Non-Medical): No  Physical Activity: Sufficiently Active (08/07/2024)   Exercise Vital Sign    Days of Exercise per Week: 5 days    Minutes of Exercise per Session: 60 min  Stress: No Stress Concern Present (08/07/2024)   [employer] of Occupational Health - Occupational Stress Questionnaire    Feeling of Stress: Not at all  Social Connections: Moderately Integrated (08/07/2024)   Social Connection and Isolation Panel    Frequency of Communication with Friends and Family: Twice a week    Frequency of Social Gatherings with Friends and Family: More than three times a  week    Attends Religious Services: Never    Active Member of Clubs or Organizations: Yes    Attends Banker Meetings: Patient declined    Marital Status: Married  Catering Manager Violence: Not At Risk (04/27/2024)   Epic    Fear of Current or Ex-Partner: No    Emotionally Abused: No    Physically Abused: No    Sexually Abused: No  Depression (PHQ2-9): Low Risk (03/10/2024)   Depression (PHQ2-9)    PHQ-2 Score: 0  Alcohol Screen: Low Risk (08/07/2024)   Alcohol Screen    Last Alcohol Screening Score (AUDIT): 3  Housing: Low Risk (08/07/2024)   Epic    Unable to Pay for Housing in the Last Year: No    Number of Times Moved in the Last Year: 0    Homeless in the Last Year: No  Utilities: Not At Risk (04/27/2024)   Epic    Threatened with loss of utilities: No  Health Literacy: Adequate Health Literacy (03/10/2024)   B1300 Health Literacy    Frequency of need for help with medical instructions: Never    Review of Systems:    Constitutional: No weight loss, fever, chills, weakness or fatigue HEENT: Eyes: No change in vision               Ears, Nose, Throat:  No change  in hearing or congestion Skin: No rash or itching Cardiovascular: No chest pain, chest pressure or palpitations   Respiratory: No SOB or cough Gastrointestinal: See HPI and otherwise negative Genitourinary: No dysuria or change in urinary frequency Neurological: No headache, dizziness or syncope Musculoskeletal: No new muscle or joint pain Hematologic: No bleeding or bruising Psychiatric: No history of depression or anxiety    Physical Exam:  Vital signs: BP 136/78 (BP Location: Left Arm, Patient Position: Sitting, Cuff Size: Normal)   Pulse 80   Ht 6' (1.829 m)   Wt 177 lb (80.3 kg)   BMI 24.01 kg/m   Constitutional: NAD, alert and cooperative Head:  Normocephalic and atraumatic. Eyes:   PEERL, EOMI. No icterus. Conjunctiva pink. Respiratory: Respirations even and unlabored. Lungs clear to auscultation bilaterally.   No wheezes, crackles, or rhonchi.  Cardiovascular:  Regular rate and rhythm. No peripheral edema, cyanosis or pallor.  Gastrointestinal:  Soft, nondistended, nontender. No rebound or guarding. Normal bowel sounds. No appreciable masses or hepatomegaly. Rectal:  Declines Msk:  Symmetrical without gross deformities. Without edema, no deformity or joint abnormality.  Neurologic:  Alert and  oriented x4;  grossly normal neurologically.  Skin:   Dry and intact without significant lesions or rashes. Psychiatric: Oriented to person, place and time. Demonstrates good judgement and reason without abnormal affect or behaviors.  Physical Exam    RELEVANT LABS AND IMAGING: CBC    Component Value Date/Time   WBC 7.2 04/21/2024 0827   RBC 4.95 04/21/2024 0827   HGB 13.4 04/28/2024 0436   HCT 40.2 04/28/2024 0436   PLT 256 04/21/2024 0827   MCV 91.9 04/21/2024 0827   MCH 29.5 04/21/2024 0827   MCHC 32.1 04/21/2024 0827   RDW 12.9 04/21/2024 0827   LYMPHSABS 1.6 09/24/2023 1050   MONOABS 0.6 09/24/2023 1050   EOSABS 0.1 09/24/2023 1050   BASOSABS 0.0 09/24/2023 1050     CMP     Component Value Date/Time   NA 138 04/21/2024 0827   K 3.7 04/21/2024 0827   CL 103 04/21/2024 0827   CO2 25 04/21/2024 0827  GLUCOSE 92 04/21/2024 0827   BUN 16 04/21/2024 0827   CREATININE 1.05 04/21/2024 0827   CREATININE 1.09 07/18/2020 0838   CALCIUM 9.1 04/21/2024 0827   PROT 7.0 09/24/2023 1050   ALBUMIN 4.6 09/24/2023 1050   AST 26 09/24/2023 1050   ALT 26 09/24/2023 1050   ALKPHOS 80 09/24/2023 1050   BILITOT 1.2 09/24/2023 1050   GFRNONAA >60 04/21/2024 0827     Assessment/Plan:   Dysphagia Dysphagia to solids ongoing since 06/2024.  Food gets stuck in suprasternal notch and patient has epigastric pain.  No significant improvement on pantoprazole 40 mg once daily though he is taking it after his lunch.  Some difficulty with liquids, but mainly solids.  No tobacco use. - EGD with possible dilation for further evaluation - Continue pantoprazole 40 mg once daily, recommend doing 30 minutes before a meal - Educated patient on lifestyle modifications - If no improvement s/p EGD consider swallow study versus manometry  Prostate cancer s/p prostatectomy 04/2024  Colon cancer screening Colonoscopy 08/2009 with digestive health specialist normal recall 10 years - Due for repeat colonoscopy, will pursue management of above prior to pursuing screening colonoscopy   Berish Bohman Mollie RIGGERS [facility] 09/21/2024, 10:30 AM  Cc: Amon Aloysius BRAVO, MD "

## 2024-09-21 NOTE — Patient Instructions (Signed)
 You have been scheduled for an endoscopy. Please follow written instructions given to you at your visit today.  If you use inhalers (even only as needed), please bring them with you on the day of your procedure.  If you take any of the following medications, they will need to be adjusted prior to your procedure:   DO NOT TAKE 7 DAYS PRIOR TO TEST- Trulicity (dulaglutide) Ozempic, Wegovy (semaglutide) Mounjaro, Zepbound (tirzepatide) Bydureon Bcise (exanatide extended release)  DO NOT TAKE 1 DAY PRIOR TO YOUR TEST Rybelsus (semaglutide) Adlyxin (lixisenatide) Victoza (liraglutide) Byetta (exanatide) ___________________________________________________________________________  _______________________________________________________  If your blood pressure at your visit was 140/90 or greater, please contact your primary care physician to follow up on this.  _______________________________________________________  If you are age 68 or older, your body mass index should be between 23-30. Your Body mass index is 24.01 kg/m. If this is out of the aforementioned range listed, please consider follow up with your Primary Care Provider.  If you are age 68 or younger, your body mass index should be between 19-25. Your Body mass index is 24.01 kg/m. If this is out of the aformentioned range listed, please consider follow up with your Primary Care Provider.   ________________________________________________________  The [facility] providers would like to encourage you to use MYCHART to communicate with providers for non-urgent requests or questions.  Due to long hold times on the telephone, sending your provider a message by [facility] may be a faster and more efficient way to get a response.  Please allow 48 business hours for a response.  Please remember that this is for non-urgent requests.  _______________________________________________________  [facility] is using a team-based  approach to care.  Your team is made up of your doctor and two to three APPS. Our APPS (Nurse Practitioners and Physician Assistants) work with your physician to ensure care continuity for you. They are fully qualified to address your health concerns and develop a treatment plan. They communicate directly with your gastroenterologist to care for you. Seeing the Advanced Practice Practitioners on your physician's team can help you by facilitating care more promptly, often allowing for earlier appointments, access to diagnostic testing, procedures, and other specialty referrals.

## 2024-09-22 ENCOUNTER — Ambulatory Visit: Admitting: Internal Medicine

## 2024-09-22 ENCOUNTER — Telehealth: Payer: Self-pay | Admitting: *Deleted

## 2024-09-22 ENCOUNTER — Other Ambulatory Visit: Payer: Self-pay | Admitting: Internal Medicine

## 2024-09-22 ENCOUNTER — Encounter: Payer: Self-pay | Admitting: Internal Medicine

## 2024-09-22 ENCOUNTER — Other Ambulatory Visit

## 2024-09-22 VITALS — BP 125/74 | HR 71 | Temp 98.1°F | Resp 14 | Ht 72.0 in | Wt 177.0 lb

## 2024-09-22 DIAGNOSIS — C155 Malignant neoplasm of lower third of esophagus: Secondary | ICD-10-CM

## 2024-09-22 DIAGNOSIS — R1013 Epigastric pain: Secondary | ICD-10-CM

## 2024-09-22 DIAGNOSIS — R1319 Other dysphagia: Secondary | ICD-10-CM

## 2024-09-22 DIAGNOSIS — K56600 Partial intestinal obstruction, unspecified as to cause: Secondary | ICD-10-CM

## 2024-09-22 DIAGNOSIS — K449 Diaphragmatic hernia without obstruction or gangrene: Secondary | ICD-10-CM

## 2024-09-22 DIAGNOSIS — C159 Malignant neoplasm of esophagus, unspecified: Secondary | ICD-10-CM

## 2024-09-22 LAB — COMPREHENSIVE METABOLIC PANEL WITH GFR
ALT: 20 U/L (ref 3–53)
AST: 21 U/L (ref 5–37)
Albumin: 4.2 g/dL (ref 3.5–5.2)
Alkaline Phosphatase: 83 U/L (ref 39–117)
BUN: 14 mg/dL (ref 6–23)
CO2: 27 meq/L (ref 19–32)
Calcium: 8.7 mg/dL (ref 8.4–10.5)
Chloride: 103 meq/L (ref 96–112)
Creatinine, Ser: 1.03 mg/dL (ref 0.40–1.50)
GFR: 75.14 mL/min
Glucose, Bld: 82 mg/dL (ref 70–99)
Potassium: 3.9 meq/L (ref 3.5–5.1)
Sodium: 137 meq/L (ref 135–145)
Total Bilirubin: 1 mg/dL (ref 0.2–1.2)
Total Protein: 6.9 g/dL (ref 6.0–8.3)

## 2024-09-22 LAB — CBC WITH DIFFERENTIAL/PLATELET
Basophils Absolute: 0 K/uL (ref 0.0–0.1)
Basophils Relative: 0.8 % (ref 0.0–3.0)
Eosinophils Absolute: 0.1 K/uL (ref 0.0–0.7)
Eosinophils Relative: 1.9 % (ref 0.0–5.0)
HCT: 40.5 % (ref 39.0–52.0)
Hemoglobin: 13.5 g/dL (ref 13.0–17.0)
Lymphocytes Relative: 21.2 % (ref 12.0–46.0)
Lymphs Abs: 1.3 K/uL (ref 0.7–4.0)
MCHC: 33.3 g/dL (ref 30.0–36.0)
MCV: 86.9 fl (ref 78.0–100.0)
Monocytes Absolute: 0.7 K/uL (ref 0.1–1.0)
Monocytes Relative: 11.1 % (ref 3.0–12.0)
Neutro Abs: 3.9 K/uL (ref 1.4–7.7)
Neutrophils Relative %: 65 % (ref 43.0–77.0)
Platelets: 259 K/uL (ref 150.0–400.0)
RBC: 4.66 Mil/uL (ref 4.22–5.81)
RDW: 13.7 % (ref 11.5–15.5)
WBC: 6 K/uL (ref 4.0–10.5)

## 2024-09-22 MED ORDER — SODIUM CHLORIDE 0.9 % IV SOLN: 500.0000 mL | INTRAVENOUS | Status: DC

## 2024-09-22 NOTE — Progress Notes (Unsigned)
 To pacu, VSS. Report to [email or web address]

## 2024-09-22 NOTE — Progress Notes (Unsigned)
 Called to room to assist during endoscopic procedure.  Patient ID and intended procedure confirmed with present staff. Received instructions for my participation in the procedure from the performing physician.

## 2024-09-22 NOTE — Telephone Encounter (Signed)
 Dr Albertus has requested CT chest/abdomen/pelvis STAT for further evaluation of esophageal tumor.  Patient has been scheduled for CT at [facility] for Monday, 09/25/24 at 12 pm arrive at 11:30 am.   auth# [id number] valid until 10/21/2024

## 2024-09-22 NOTE — Patient Instructions (Addendum)
" ° °  To lab for lab work prior to discharge today  Sugical consult to be made by Dr Pamula office   CT scan chest abd, and Pelvis w/ contrast -be [place] entrance A @11 ;30 for 12:00   Follow soft diet     YOU HAD AN ENDOSCOPIC PROCEDURE TODAY AT THE [facility]:   Refer to the procedure report that was given to you for any specific questions about what was found during the examination.  If the procedure report does not answer your questions, please call your gastroenterologist to clarify.  If you requested that your care partner not be given the details of your procedure findings, then the procedure report has been included in a sealed envelope for you to review at your convenience later.  YOU SHOULD EXPECT: Some feelings of bloating in the abdomen. Passage of more gas than usual.  Walking can help get rid of the air that was put into your GI tract during the procedure and reduce the bloating. If you had a lower endoscopy (such as a colonoscopy or flexible sigmoidoscopy) you may notice spotting of blood in your stool or on the toilet paper. If you underwent a bowel prep for your procedure, you may not have a normal bowel movement for a few days.  Please Note:  You might notice some irritation and congestion in your nose or some drainage.  This is from the oxygen used during your procedure.  There is no need for concern and it should clear up in a day or so.  SYMPTOMS TO REPORT IMMEDIATELY:  Following upper endoscopy (EGD)  Vomiting of blood or coffee ground material  New chest pain or pain under the shoulder blades  Painful or persistently difficult swallowing  New shortness of breath  Fever of 100F or higher  Black, tarry-looking stools  For urgent or emergent issues, a gastroenterologist can be reached at any hour by calling [phone]. Do not use MyChart messaging for urgent concerns.    DIET:  We do recommend a small meal at first, but then you may proceed to  your regular diet.  Drink plenty of fluids but you should avoid alcoholic beverages for 24 hours.  ACTIVITY:  You should plan to take it easy for the rest of today and you should NOT DRIVE or use heavy machinery until tomorrow (because of the sedation medicines used during the test).    FOLLOW UP: Our staff will call the number listed on your records the next business day following your procedure.  We will call around 7:15- 8:00 am to check on you and address any questions or concerns that you may have regarding the information given to you following your procedure. If we do not reach you, we will leave a message.     If any biopsies were taken you will be contacted by phone or by letter within the next 1-3 weeks.  Please call us  at [phone] if you have not heard about the biopsies in 3 weeks.    SIGNATURES/CONFIDENTIALITY: You and/or your care partner have signed paperwork which will be entered into your electronic medical record.  These signatures attest to the fact that that the information above on your After Visit Summary has been reviewed and is understood.  Full responsibility of the confidentiality of this discharge information lies with you and/or your care-partner.       "

## 2024-09-22 NOTE — Progress Notes (Unsigned)
 Pt's states no medical or surgical changes since previsit or office visit.

## 2024-09-22 NOTE — Op Note (Signed)
 [facility] Patient Name: Tommy Mcbride Procedure Date: 09/22/2024 4:02 PM MRN: [id number] Endoscopist: Gordy CHRISTELLA Starch , MD, [id number] Age: 69 Referring MD:  Date of Birth: [birth year] Gender: Male Account #: [id number] Procedure:                Upper GI endoscopy Indications:              Epigastric abdominal pain, Dysphagia, Odynophagia Medicines:                Monitored Anesthesia Care Procedure:                Pre-Anesthesia Assessment:                           - Prior to the procedure, a History and Physical                            was performed, and patient medications and                            allergies were reviewed. The patient's tolerance of                            previous anesthesia was also reviewed. The risks                            and benefits of the procedure and the sedation                            options and risks were discussed with the patient.                            All questions were answered, and informed consent                            was obtained. Prior Anticoagulants: The patient has                            taken no anticoagulant or antiplatelet agents. ASA                            Grade Assessment: II - A patient with mild systemic                            disease. After reviewing the risks and benefits,                            the patient was deemed in satisfactory condition to                            undergo the procedure.                           After obtaining informed consent, the endoscope was  passed under direct vision. Throughout the                            procedure, the patient's blood pressure, pulse, and                            oxygen saturations were monitored continuously. The                            GIF [id number] #[id number] was introduced through the                            mouth, and advanced to the second part of duodenum.                            The  upper GI endoscopy was accomplished without                            difficulty. The patient tolerated the procedure                            well. Scope In: Scope Out: Findings:                 A fungating mass was found in the lower third of                            the esophagus, 36 to 42 cm from the incisors. The                            mass was partially obstructing and partially                            circumferential (involving two thirds of the lumen                            circumference). The mass causes luminal narrowing                            and resistance was encountered as scope passed into                            the proximal stomach. Thereafter this was                            self-limited bleeding with dilation effect.                            Multiple biopsies were taken with a cold forceps                            for histology and sent rush. The mass appears to  arise in the setting of Barrett's esophagus.                           A small hiatal hernia was present.                           The entire examined stomach was normal.                           The examined duodenum was normal. Complications:            No immediate complications. Estimated Blood Loss:     Estimated blood loss was minimal. 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 Mass causes luminal                            narrowing with dilation effect from endoscope                            passage. Background Barrett's esophagus. Biopsied                            extensively.                           - Small hiatal hernia.                           - Normal stomach.                           - Normal examined duodenum. Recommendation:           - Patient has a contact number  available for                            emergencies. The signs and symptoms of potential                            delayed complications were discussed with the                            patient. Return to normal activities tomorrow.                            Written discharge instructions were provided to the                            patient.                           -  Soft diet.                           - Continue present medications. Continue                            pantoprazole 40 mg daily.                           - Await pathology results.                           - CT scan chest, abdomen and pelvis with contrast.                           - CT surgery (Dr. Shyrl) and oncology (Dr.                            [facility] or Dr. Lanny) consults. Gordy CHRISTELLA Starch, MD 09/22/2024 4:44:10 PM This report has been signed electronically.

## 2024-09-22 NOTE — Telephone Encounter (Signed)
 Patient has been advised of CT chest/abdomen/pelvis at [facility] on 09/25/24 at 12 pm with 1130 am arrival by [facility] nursing at the time of his recovery from endoscopy.

## 2024-09-22 NOTE — Progress Notes (Unsigned)
 See office note dated yesterday for details and current H&P  Patient presenting for upper endoscopy to evaluate esophageal dysphagia and epigastric discomfort.  He remains appropriate for upper endoscopy in the [facility] with possible dilation today.

## 2024-09-23 LAB — CEA: CEA: <2 ng/mL

## 2024-09-25 ENCOUNTER — Ambulatory Visit: Payer: Self-pay | Admitting: Internal Medicine

## 2024-09-25 ENCOUNTER — Ambulatory Visit (HOSPITAL_COMMUNITY)
Admission: RE | Admit: 2024-09-25 | Discharge: 2024-09-25 | Disposition: A | Discharge status: Home / Self Care | Source: Ambulatory Visit | Attending: Internal Medicine | Admitting: Internal Medicine

## 2024-09-25 ENCOUNTER — Telehealth: Payer: Self-pay

## 2024-09-25 ENCOUNTER — Other Ambulatory Visit: Payer: Self-pay | Admitting: Internal Medicine

## 2024-09-25 DIAGNOSIS — C159 Malignant neoplasm of esophagus, unspecified: Secondary | ICD-10-CM

## 2024-09-25 DIAGNOSIS — R1310 Dysphagia, unspecified: Secondary | ICD-10-CM

## 2024-09-25 DIAGNOSIS — R1319 Other dysphagia: Secondary | ICD-10-CM

## 2024-09-25 DIAGNOSIS — C155 Malignant neoplasm of lower third of esophagus: Secondary | ICD-10-CM

## 2024-09-25 LAB — SURGICAL PATHOLOGY

## 2024-09-25 MED ORDER — IOHEXOL 350 MG/ML SOLN
75.0000 mL | Freq: Once | INTRAVENOUS | Status: AC | PRN
  Administered 2024-09-25: 75 mL via INTRAVENOUS

## 2024-09-25 MED ORDER — SUCRALFATE 1 GM/10ML PO SUSP: 1.0000 g | Freq: Three times a day (TID) | ORAL | 3 refills | Status: DC

## 2024-09-25 NOTE — Progress Notes (Signed)
 Spoke to the patient by phone, recently discovered esophageal tumor  He is having some dysphagia and odynophagia which Tylenol  is actually helping  I told him he could use up to 4000 mg in a 24-hour period, he is currently using 325 mg twice daily.  I am going to call in liquid sucralfate  1 g to use 3 times daily AC and at bedtime He will continue once daily PPI  If he eats slowly he is able to consume liquids and soft solids We briefly discussed PEG tube but at this point this is slightly premature, he very well may need 1 as treatment gets underway.  I am going to refer to oncology and CT surgery. Also may need EUS  CT scans already done today, results pending

## 2024-09-25 NOTE — Telephone Encounter (Signed)
 Attempted f/u call. No answer, left VM.

## 2024-09-25 NOTE — Progress Notes (Signed)
 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  Tylenol  has been helping.  I called in liquid sucralfate  3 times daily AC and at bedtime  Please initiate oncology referrals to Dr. Lanny or Dr. Sherrill CT surgery referral to Dr. Shyrl Patient also may need EUS depending on CT findings; I will alert Dr. Wilhelmenia as well.  Dr. Wilhelmenia is away this week and if there is a delay for EUS we may need to have EUS done at a tertiary center.  No path letter needed

## 2024-09-26 ENCOUNTER — Other Ambulatory Visit: Payer: Self-pay

## 2024-09-26 DIAGNOSIS — C159 Malignant neoplasm of esophagus, unspecified: Secondary | ICD-10-CM

## 2024-09-28 ENCOUNTER — Inpatient Hospital Stay

## 2024-09-28 ENCOUNTER — Inpatient Hospital Stay: Admitting: Hematology

## 2024-09-28 ENCOUNTER — Encounter: Payer: Self-pay | Admitting: Hematology

## 2024-09-28 VITALS — BP 126/70 | HR 84 | Temp 98.4°F | Resp 17 | Ht 72.0 in | Wt 177.0 lb

## 2024-09-28 DIAGNOSIS — K219 Gastro-esophageal reflux disease without esophagitis: Secondary | ICD-10-CM | POA: Diagnosis not present

## 2024-09-28 DIAGNOSIS — E785 Hyperlipidemia, unspecified: Secondary | ICD-10-CM | POA: Diagnosis not present

## 2024-09-28 DIAGNOSIS — J45909 Unspecified asthma, uncomplicated: Secondary | ICD-10-CM | POA: Diagnosis not present

## 2024-09-28 DIAGNOSIS — C61 Malignant neoplasm of prostate: Secondary | ICD-10-CM | POA: Insufficient documentation

## 2024-09-28 DIAGNOSIS — E860 Dehydration: Secondary | ICD-10-CM | POA: Diagnosis not present

## 2024-09-28 DIAGNOSIS — T451X5A Adverse effect of antineoplastic and immunosuppressive drugs, initial encounter: Secondary | ICD-10-CM | POA: Diagnosis not present

## 2024-09-28 DIAGNOSIS — C155 Malignant neoplasm of lower third of esophagus: Secondary | ICD-10-CM | POA: Diagnosis not present

## 2024-09-28 DIAGNOSIS — R112 Nausea with vomiting, unspecified: Secondary | ICD-10-CM | POA: Diagnosis not present

## 2024-09-28 DIAGNOSIS — I700 Atherosclerosis of aorta: Secondary | ICD-10-CM | POA: Insufficient documentation

## 2024-09-28 DIAGNOSIS — Z79630 Long term (current) use of alkylating agent: Secondary | ICD-10-CM | POA: Insufficient documentation

## 2024-09-28 DIAGNOSIS — I2510 Atherosclerotic heart disease of native coronary artery without angina pectoris: Secondary | ICD-10-CM | POA: Insufficient documentation

## 2024-09-28 DIAGNOSIS — Z87442 Personal history of urinary calculi: Secondary | ICD-10-CM | POA: Insufficient documentation

## 2024-09-28 DIAGNOSIS — Z79620 Long term (current) use of immunosuppressive biologic: Secondary | ICD-10-CM | POA: Insufficient documentation

## 2024-09-28 DIAGNOSIS — Z5111 Encounter for antineoplastic chemotherapy: Secondary | ICD-10-CM | POA: Insufficient documentation

## 2024-09-28 DIAGNOSIS — Z8701 Personal history of pneumonia (recurrent): Secondary | ICD-10-CM | POA: Diagnosis not present

## 2024-09-28 DIAGNOSIS — Z79899 Other long term (current) drug therapy: Secondary | ICD-10-CM | POA: Diagnosis not present

## 2024-09-28 DIAGNOSIS — Z79631 Long term (current) use of antimetabolite agent: Secondary | ICD-10-CM | POA: Diagnosis not present

## 2024-09-28 DIAGNOSIS — Z79633 Long term (current) use of mitotic inhibitor: Secondary | ICD-10-CM | POA: Insufficient documentation

## 2024-09-28 DIAGNOSIS — R978 Other abnormal tumor markers: Secondary | ICD-10-CM | POA: Diagnosis not present

## 2024-09-28 DIAGNOSIS — Z803 Family history of malignant neoplasm of breast: Secondary | ICD-10-CM | POA: Diagnosis not present

## 2024-09-28 DIAGNOSIS — C159 Malignant neoplasm of esophagus, unspecified: Secondary | ICD-10-CM | POA: Insufficient documentation

## 2024-09-28 DIAGNOSIS — Z5112 Encounter for antineoplastic immunotherapy: Secondary | ICD-10-CM | POA: Insufficient documentation

## 2024-09-28 MED ORDER — PROCHLORPERAZINE MALEATE 10 MG PO TABS: 10.0000 mg | ORAL_TABLET | Freq: Four times a day (QID) | ORAL | 1 refills | Status: AC | PRN

## 2024-09-28 MED ORDER — LIDOCAINE-PRILOCAINE 2.5-2.5 % EX CREA: TOPICAL_CREAM | CUTANEOUS | 3 refills | Status: AC

## 2024-09-28 MED ORDER — ONDANSETRON HCL 8 MG PO TABS: 8.0000 mg | ORAL_TABLET | Freq: Three times a day (TID) | ORAL | 1 refills | Status: AC | PRN

## 2024-09-28 NOTE — Progress Notes (Signed)
 " [facility]   Telephone:(336) [phone] Fax:(336) [phone]   Clinic New Consult Note   Patient Care Team: Amon Aloysius BRAVO, MD as PCP - General (Internal Medicine) Shana Steffan ORN, MD as Referring Physician (Gastroenterology) [facility] (Ophthalmology) Ardis Evalene CROME, RN as Oncology Nurse Navigator 09/28/2024  CHIEF COMPLAINTS/PURPOSE OF CONSULTATION:  Newly diagnosed distal esophageal cancer  REFERRING PHYSICIAN: Dr. Albertus   Discussed the use of AI scribe software for clinical note transcription with the patient, who gave verbal consent to proceed.  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  For three weeks he has had progressively worsening dysphagia, initially to solids and now to liquids. Meals can take up to 45 minutes. He must chew thoroughly and use water  to assist swallowing. Early swallows are tolerated, then he develops a chest constriction sensation that requires him to pause, hiccup, and burp until it resolves. Odynophagia sometime can be severe and brings him to tears. He denies coughing, choking, aspiration, or regurgitation.  He has shifted to soft foods including noodles, mashed potatoes, scrambled eggs, yogurt, and protein drinks. He avoids bread, steak, hard-boiled eggs, and salads. He has lost 2 pounds over 4 weeks, which he attributes to dietary changes, and believes his caloric intake is adequate.  Current medications are omeprazole , salmeterol, and sucralfate . Taking sucralfate  at bedtime improves his ability to eat breakfast. Acetaminophen  325 mg daily provides partial pain relief. He is avoiding supplements due to pill size and swallowing difficulty. He has no hypertension, heart disease, or diabetes. He has elevated HDL and LDL cholesterol.  Endoscopy on September 22, 2024, showed a  distal esophageal mass. Biopsy confirmed intramucosal adenocarcinoma. CT demonstrated distal esophageal wall thickening with luminal narrowing and enlarged regional lymph nodes up to 2.3 cm. CEA was not elevated. A prior PET scan for prostate cancer was negative.  He had a radical prostatectomy in August 2025 for early-stage prostate cancer without adjuvant therapy and with no evidence of recurrence. Other surgeries include knee replacement, tonsillectomy, and umbilical hernia repair.     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    Pneumonia    Prostate cancer [facility])     SURGICAL HISTORY: Past Surgical History:  Procedure Laterality Date   JOINT REPLACEMENT Left 08/2020   Knee replacement   REPLACEMENT TOTAL KNEE Left    ~08-2020   ROBOT ASSISTED LAPAROSCOPIC RADICAL PROSTATECTOMY N/A 04/27/2024   Procedure: PROSTATECTOMY, RADICAL, ROBOT-ASSISTED, LAPAROSCOPIC;  Surgeon: Renda Glance, MD;  Location: [facility];  Service: Urology;  Laterality: N/A;  LEVEL 2   TONSILLECTOMY     UMBILICAL HERNIA REPAIR N/A 04/27/2024   Procedure: REPAIR, HERNIA, UMBILICAL, ADULT;  Surgeon: Renda Glance, MD;  Location: [facility];  Service: Urology;  Laterality: N/A;    SOCIAL HISTORY: Social History   Socioeconomic History   Marital status: Married    Spouse name: Not on file   Number of children: 2   Years of education: Not on file   Highest education level: Bachelor's degree (e.g., BA, AB, BS)  Occupational History   Occupation: tennis pro  Tobacco Use  Smoking status: Never   Smokeless tobacco: Never  Vaping Use   Vaping status: Never Used  Substance and Sexual Activity   Alcohol use: Yes    Alcohol/week: 4.0 standard drinks of alcohol    Types: 4 Cans of beer per week    Comment: few beers a week    Drug use: Never   Sexual activity: Yes    Birth control/protection: Condom  Other Topics Concern   Not on file  Social History Narrative   Tennis pro, very active.   2 daughters    Shanda  MD   Kathern: grad school psychology    Social Drivers of Health   Tobacco Use: Low Risk (09/28/2024)   Patient History    Smoking Tobacco Use: Never    Smokeless Tobacco Use: Never    Passive Exposure: Not on file  Financial Resource Strain: Low Risk (08/07/2024)   Overall Financial Resource Strain (CARDIA)    Difficulty of Paying Living Expenses: Not hard at all  Food Insecurity: No Food Insecurity (09/26/2024)   Epic    Worried About Programme Researcher, Broadcasting/film/video in the Last Year: Never true    Ran Out of Food in the Last Year: Never true  Transportation Needs: No Transportation Needs (09/26/2024)   Epic    Lack of Transportation (Medical): No    Lack of Transportation (Non-Medical): No  Physical Activity: Sufficiently Active (08/07/2024)   Exercise Vital Sign    Days of Exercise per Week: 5 days    Minutes of Exercise per Session: 60 min  Stress: No Stress Concern Present (08/07/2024)   [employer] of Occupational Health - Occupational Stress Questionnaire    Feeling of Stress: Not at all  Social Connections: Moderately Integrated (08/07/2024)   Social Connection and Isolation Panel    Frequency of Communication with Friends and Family: Twice a week    Frequency of Social Gatherings with Friends and Family: More than three times a week    Attends Religious Services: Never    Database Administrator or Organizations: Yes    Attends Banker Meetings: Patient declined    Marital Status: Married  Catering Manager Violence: Not At Risk (04/27/2024)   Epic    Fear of Current or Ex-Partner: No    Emotionally Abused: No    Physically Abused: No    Sexually Abused: No  Depression (PHQ2-9): Low Risk (03/10/2024)   Depression (PHQ2-9)    PHQ-2 Score: 0  Alcohol Screen: Low Risk  (08/07/2024)   Alcohol Screen    Last Alcohol Screening Score (AUDIT): 3  Housing: Low Risk (09/26/2024)   Epic    Unable to Pay for Housing in the Last Year: No    Number of Times Moved in the Last Year: 0    Homeless in the Last Year: No  Utilities: Not At Risk (09/26/2024)   Epic    Threatened with loss of utilities: No  Health Literacy: Adequate Health Literacy (03/10/2024)   B1300 Health Literacy    Frequency of need for help with medical instructions: Never    FAMILY HISTORY: Family History  Problem Relation Age of Onset   Breast cancer Mother 62   Arthritis Mother    Cancer Mother    Stroke Father 12   Hyperlipidemia Father    Breast cancer Maternal Aunt 46   CAD Neg Hx    Diabetes Neg Hx    Colon cancer Neg Hx    Prostate cancer Neg Hx  Stomach cancer Neg Hx    Rectal cancer Neg Hx    Esophageal cancer Neg Hx     ALLERGIES:  is allergic to penicillins.  MEDICATIONS:  Current Outpatient Medications  Medication Sig Dispense Refill   budesonide -formoterol  (BREYNA ) 80-4.5 MCG/ACT inhaler Inhale 2 puffs into the lungs 2 (two) times daily as needed (respiratory issues.).     diclofenac Sodium (VOLTAREN) 1 % GEL Apply 1 Application topically 4 (four) times daily as needed (aches/pains.). (Patient not taking: Reported on 09/22/2024)     Multiple Vitamin (MULTIVITAMIN WITH MINERALS) TABS tablet Take 1 tablet by mouth daily.     Omega-3 Fatty Acids (SUSTAINABLE VEGAN OMEGA-3 PO) Take by mouth.     omeprazole  (PRILOSEC) 40 MG capsule Take 1 capsule (40 mg total) by mouth daily. 90 capsule 0   Policosanol 30 MG TABS Policosanol     POLICOSANOL PO Take by mouth.     sucralfate  (CARAFATE ) 1 GM/10ML suspension Take 10 mLs (1 g total) by mouth 4 (four) times daily -  with meals and at bedtime. 420 mL 3   Tart Cherry (TART CHERRY ULTRA) 1200 MG CAPS Take by mouth.     No current facility-administered medications for this visit.    REVIEW OF SYSTEMS:   Constitutional: Denies  fevers, chills or abnormal night sweats Eyes: Denies blurriness of vision, double vision or watery eyes Ears, nose, mouth, throat, and face: Denies mucositis or sore throat Respiratory: Denies cough, dyspnea or wheezes Cardiovascular: Denies palpitation, chest discomfort or lower extremity swelling Gastrointestinal:  Denies nausea, heartburn or change in bowel habits Skin: Denies abnormal skin rashes Lymphatics: Denies new lymphadenopathy or easy bruising Neurological:Denies numbness, tingling or new weaknesses Behavioral/Psych: Mood is stable, no new changes  All other systems were reviewed with the patient and are negative.  PHYSICAL EXAMINATION: ECOG PERFORMANCE STATUS: 1 - Symptomatic but completely ambulatory  Vitals:   09/28/24 1417  BP: 126/70  Pulse: 84  Resp: 17  Temp: 98.4 F (36.9 C)  SpO2: 98%   Filed Weights   09/28/24 1417  Weight: 177 lb (80.3 kg)    GENERAL:alert, no distress and comfortable SKIN: skin color, texture, turgor are normal, no rashes or significant lesions EYES: normal, conjunctiva are pink and non-injected, sclera clear OROPHARYNX:no exudate, no erythema and lips, buccal mucosa, and tongue normal  NECK: supple, thyroid  normal size, non-tender, without nodularity LYMPH:  no palpable lymphadenopathy in the cervical, axillary or inguinal LUNGS: clear to auscultation and percussion with normal breathing effort HEART: regular rate & rhythm and no murmurs and no lower extremity edema ABDOMEN:abdomen soft, non-tender and normal bowel sounds Musculoskeletal:no cyanosis of digits and no clubbing  PSYCH: alert & oriented x 3 with fluent speech NEURO: no focal motor/sensory deficits  Physical Exam CHEST: Lungs clear to auscultation. EXTREMITIES: No swelling in extremities.  LABORATORY DATA:  I have reviewed the data as listed    Latest Ref Rng & Units 09/22/2024    5:10 PM 04/28/2024    4:36 AM 04/27/2024    1:45 PM  CBC  WBC 4.0 - 10.5 K/uL 6.0      Hemoglobin 13.0 - 17.0 g/dL 86.4  86.5  86.2   Hematocrit 39.0 - 52.0 % 40.5  40.2  43.0   Platelets 150.0 - 400.0 K/uL 259.0       @cmpl @  RADIOGRAPHIC STUDIES: I have personally reviewed the radiological images as listed and agreed with the findings in the report. CT CHEST ABDOMEN PELVIS W CONTRAST Result  Date: 09/25/2024 CLINICAL DATA:  Esophageal cancer staging.  * Tracking Code: BO * EXAM: CT CHEST, ABDOMEN, AND PELVIS WITH CONTRAST TECHNIQUE: Multidetector CT imaging of the chest, abdomen and pelvis was performed following the standard protocol during bolus administration of intravenous contrast. RADIATION DOSE REDUCTION: This exam was performed according to the departmental dose-optimization program which includes automated exposure control, adjustment of the mA and/or kV according to patient size and/or use of iterative reconstruction technique. CONTRAST:  75mL OMNIPAQUE  IOHEXOL  350 MG/ML SOLN COMPARISON:  Nuclear medicine PET dated 02/08/2024 FINDINGS: CT CHEST FINDINGS Cardiovascular: Normal heart size. No significant pericardial fluid/thickening. Great vessels are normal in course and caliber. No central pulmonary emboli. Coronary artery calcifications. Mediastinum/Nodes: Imaged thyroid  gland without nodules meeting criteria for imaging follow-up by size. Mural thickening of the lower esophagus spanning 6.1 cm above the gastroesophageal junction (5:54). Left paraesophageal lymph node measures 10 mm (2:55). Lungs/Pleura: The central airways are patent. 5 mm ground-glass nodule in the left lower lobe (4:113), unchanged from 02/08/2024. Peripheral 3 mm right lower lobe calcified granuloma (4:125). No focal consolidation. No pneumothorax. No pleural effusion. Musculoskeletal: No acute or abnormal lytic or blastic osseous lesions. CT ABDOMEN PELVIS FINDINGS Hepatobiliary: No focal hepatic lesions. No intra or extrahepatic biliary ductal dilation. Normal gallbladder. Pancreas: No focal lesions or  main ductal dilation. Spleen: Normal in size without focal abnormality. Adrenals/Urinary Tract: No adrenal nodules. No suspicious renal mass or hydronephrosis. 4 mm nonobstructing stone in the left lower pole. Bilateral simple/minimally complicated cysts measuring up to 3.5 cm in the left upper lobe (2:70). No specific follow-up imaging recommended. No focal bladder wall thickening. Stomach/Bowel: Normal appearance of the stomach. No evidence of bowel wall thickening, distention, or inflammatory changes. Colonic diverticulosis without acute diverticulitis. Normal appendix. Vascular/Lymphatic: Aortic atherosclerosis. 2.3 cm right paracardial lymph node (2:64). Reproductive: Prostatectomy. Other: No free fluid, fluid collection, or free air. Musculoskeletal: No acute or abnormal lytic or blastic osseous findings. Peripherally sclerotic rounded foci within the right iliac (2:104, 105), unchanged from 02/08/2024. IMPRESSION: 1. Mural thickening of the lower esophagus spanning 6.1 cm above the gastroesophageal junction, consistent with known esophageal cancer. 2. Left paraesophageal and right paracardial lymph nodes measuring up to 10 mm, suspicious for nodal metastases. 3. No evidence of metastatic disease in the abdomen or pelvis. 4. Unchanged 5 mm ground-glass nodule in the left lower lobe, indeterminate. 5.  Aortic Atherosclerosis (ICD10-I70.0). Electronically Signed   By: Limin  Xu M.D.   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 He reports progressive dysphagia and mild weight loss but maintains nutrition with dietary modifications. No evidence of distant metastasis on CT; PET scan and endoscopic ultrasound are pending for definitive staging. The malignancy is invasive,  and neoadjuvant chemotherapy is recommended to improve surgical outcomes and reduce recurrence risk. The anticipated treatment course is intensive, with significant impact on daily function and physical activity, and carries risks of acute and chronic side effects including neuropathy, fatigue, nausea, and infection. The goal is curative, but there remains a substantial risk of recurrence even after multimodal therapy. - Ordered PET scan for further staging and assessment of metastatic disease. - Ordered molecular testing including PD-L1 and MMR status to guide immunotherapy options. - Ordered Guardian 360 enzyme testing and Guardian Review ctDNA for chemotherapy safety and  disease monitoring. - Ordered CA 19-9 tumor marker for additional disease monitoring. - Referred to dietitian for nutritional counseling and support. - Referred to social worker for emotional and financial support. - Coordinated with surgical oncology for evaluation and surgical planning (visit scheduled next week). - Coordinated with gastroenterology for endoscopic ultrasound and possible repeat biopsy to confirm depth of invasion and invasive disease. - Monitored weight and nutritional status; encouraged continued intake of soft/liquid foods and protein supplements. - Provided anticipatory guidance regarding aspiration risk; advised to avoid lying down for 2-4 hours after eating and to avoid solid foods that are difficult to swallow. - Continued supportive medications including acetaminophen  for pain, omeprazole , and sucralfate  as tolerated. - Planned neoadjuvant chemotherapy (three-drug regimen plus immunotherapy) for four months prior to surgery, pending final staging and molecular results. - Planned post-operative immunotherapy if indicated. - Will monitor for chemotherapy side effects including neuropathy, fatigue, nausea, and infection; will provide premedication and supportive care as needed. - Scheduled chemotherapy  education class for him and his family prior to initiation of treatment. - Will monitor disease response with repeat imaging after 2-3 months of chemotherapy. - Will monitor tumor markers monthly if positive. - Encouraged physical activity to reduce risk of thromboembolism. - Will maintain communication with primary care provider for ongoing medical management and baseline assessment.  Plan -PET asap  - I recommended neoadjuvant chemotherapy FLOT and durvalumab, plan to start in 1-2 weeks - She is scheduled to see thoracic surgeon Dr. Shyrl next week - Ordered PD-L1, MMR and HER2 on his biopsy sample  -EUS scheduled for 2/2, will repeat biopsy  -PORT placement by IR  -f/u after PET   Orders Placed This Encounter  Procedures   NM PET Image Initial (PI) Skull Base To Thigh    Standing Status:   Future    Expected Date:   10/05/2024    Expiration Date:   09/28/2025    If indicated for the ordered procedure, I authorize the administration of a radiopharmaceutical per Radiology protocol:   Yes    Preferred imaging location?:   [facility]    All questions were answered. The patient knows to call the clinic with any problems, questions or concerns. I spent 55 minutes counseling the patient face to face. The total time spent in the appointment was 60 minutes including review of chart and various tests results, discussions about plan of care and coordination of care plan.     Onita Mattock, MD 09/28/2024     "

## 2024-09-28 NOTE — Progress Notes (Signed)
 START ON PATHWAY REGIMEN - Gastroesophageal     A cycle is every 14 days:     Docetaxel      Oxaliplatin      Leucovorin      Fluorouracil   **Always confirm dose/schedule in your pharmacy ordering system**  Patient Characteristics: Esophageal & GE Junction, Adenocarcinoma, Preoperative or Nonsurgical Candidate, M0 (Clinical Staging), cT2 or Higher or cN+, Surgical Candidate (Up to cT4a), Esophageal Disease Classification: Esophageal Histology: Adenocarcinoma Therapeutic Status: Preoperative or Nonsurgical Candidate, M0 (Clinical Staging) AJCC T Category: cTX AJCC N Category: cNX AJCC M Category: cM0 AJCC 8 Stage Grouping: IVA AJCC Grade: GX Intent of Therapy: Curative Intent, Discussed with Patient

## 2024-09-28 NOTE — Progress Notes (Signed)
 PATIENT NAVIGATOR PROGRESS NOTE  Name: Tommy Mcbride Date: 09/28/2024 MRN: [id number]  DOB: 1956-02-03   Reason for visit:  Initial Med/Onc visit with Dr. Onita Mattock  Comments:   Met with patient and his daughter during his initial visit with Dr. Mattock. Patient was given [facility] binder with information specific to his diagnosis. Patient was also given my direct contact information and was instructed to contact office with any questions or concerns.    Time spent counseling/coordinating care: > 60 minutes

## 2024-09-29 ENCOUNTER — Telehealth: Payer: Self-pay

## 2024-09-29 ENCOUNTER — Inpatient Hospital Stay

## 2024-09-29 ENCOUNTER — Ambulatory Visit (INDEPENDENT_AMBULATORY_CARE_PROVIDER_SITE_OTHER): Payer: Medicare Other | Admitting: Internal Medicine

## 2024-09-29 ENCOUNTER — Other Ambulatory Visit: Payer: Self-pay

## 2024-09-29 ENCOUNTER — Other Ambulatory Visit: Payer: Self-pay | Admitting: Pharmacist

## 2024-09-29 ENCOUNTER — Encounter: Payer: Self-pay | Admitting: Internal Medicine

## 2024-09-29 VITALS — BP 136/80 | HR 89 | Temp 97.8°F | Resp 16 | Ht 72.0 in | Wt 175.5 lb

## 2024-09-29 DIAGNOSIS — Z0000 Encounter for general adult medical examination without abnormal findings: Secondary | ICD-10-CM | POA: Diagnosis not present

## 2024-09-29 DIAGNOSIS — C155 Malignant neoplasm of lower third of esophagus: Secondary | ICD-10-CM | POA: Diagnosis not present

## 2024-09-29 MED ORDER — ACETAMINOPHEN EXTRA STRENGTH 500 MG/15ML PO LIQD: 30.0000 mL | Freq: Three times a day (TID) | ORAL | 1 refills | Status: AC | PRN

## 2024-09-29 MED ORDER — TRIAMCINOLONE ACETONIDE 0.1 % EX CREA: 1.0000 | TOPICAL_CREAM | Freq: Two times a day (BID) | CUTANEOUS | 2 refills | Status: DC | PRN

## 2024-09-29 NOTE — Assessment & Plan Note (Signed)
 Here for CPX  Other issues: Esophageal cancer: Recently diagnosed, undergoing workup before treatment.  Emotional support provided, he knows to call me anytime if needed. Will send a prescription for liquid Tylenol , he has some difficult time swallowing tablets.   Dermatitis: Occasional anorectal dermatitis, request a refill on triamcinolone , last time prescribed was 4 years ago.  RF sent Asthma: Well-controlled with Symbicort  as needed RTC 1 year

## 2024-09-29 NOTE — Progress Notes (Signed)
 "  Subjective:    Patient ID: Tommy Mcbride, male    DOB: 01/28/1956, 69 y.o.   MRN: [id number]  DOS:  09/29/2024 Here for CPX  In the last few months developed significant dysphagia, workup was done, diagnosed with esophageal cancer.   Review of Systems Other than dysphagia and the emotions regards to recent diagnosis of cancer he is asymptomatic.  He is a careers information officer, he is still working.  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    Pneumonia    Prostate cancer [facility])     Past Surgical History:  Procedure Laterality Date   JOINT REPLACEMENT Left 08/2020   Knee replacement   REPLACEMENT TOTAL KNEE Left    ~08-2020   ROBOT ASSISTED LAPAROSCOPIC RADICAL PROSTATECTOMY N/A 04/27/2024   Procedure: PROSTATECTOMY, RADICAL, ROBOT-ASSISTED, LAPAROSCOPIC;  Surgeon: Renda Glance, MD;  Location: [facility];  Service: Urology;  Laterality: N/A;  LEVEL 2   TONSILLECTOMY     UMBILICAL HERNIA REPAIR N/A 04/27/2024   Procedure: REPAIR, HERNIA, UMBILICAL, ADULT;  Surgeon: Renda Glance, MD;  Location: [facility];  Service: Urology;  Laterality: N/A;    Current Outpatient Medications  Medication Instructions   Acetaminophen  Extra Strength 500 MG/15ML LIQD 30 mLs, Oral, 3 times daily PRN   budesonide -formoterol  (BREYNA ) 80-4.5 MCG/ACT inhaler 2 puffs, 2 times daily PRN   lidocaine -prilocaine  (EMLA ) cream Apply to affected area once   Multiple Vitamin (MULTIVITAMIN WITH MINERALS) TABS tablet 1 tablet, Daily   Omega-3 Fatty Acids (SUSTAINABLE VEGAN OMEGA-3 PO) Take by mouth.   omeprazole  (PRILOSEC) 40 mg, Oral, Daily   ondansetron  (ZOFRAN ) 8 mg, Oral, Every 8 hours PRN, Start on the third day after chemotherapy.   Policosanol 30 MG TABS Policosanol    POLICOSANOL PO Take by mouth.   prochlorperazine  (COMPAZINE ) 10 mg, Oral, Every 6 hours PRN   sucralfate  (CARAFATE ) 1 g, Oral, 3 times daily with meals & bedtime   triamcinolone  cream (KENALOG ) 0.1 % 1 Application, Topical, 2 times daily PRN       Objective:   Physical Exam BP 136/80   Pulse 89   Temp 97.8 F (36.6 C) (Oral)   Resp 16   Ht 6' (1.829 m)   Wt 175 lb 8 oz (79.6 kg)   SpO2 98%   BMI 23.80 kg/m  General: Well developed, NAD, BMI noted HEENT:  Normocephalic . Face symmetric, atraumatic Lungs:  CTA B Normal respiratory effort, no intercostal retractions, no accessory muscle use. Heart: RRR,  no murmur.  Abdomen:  Not distended, soft, non-tender. No rebound or rigidity.   Lower extremities: no pretibial edema bilaterally  Skin: Exposed areas without rash. Not pale. Not jaundice Neurologic:  alert & oriented X3.  Speech normal, gait appropriate for age and unassisted Strength symmetric and appropriate for age.  Psych: Cognition and judgment appear intact.  Cooperative with normal attention span and concentration.  Behavior appropriate. No anxious or depressed appearing.     Assessment   Assessment Hyperlipidemia--will be reluctant to take statins/zetia Asthma DJD  Dermatitis:Uses topical steroids as needed scrotal pain, 2014, saw urology, DX spermatocele, now asymptomatic CV:Saw cardiology 2023, -Zio patch shows some episode of  SVT. -Echo: EF 55 to 60%, mildly dilated aortic sinus.  Repeated TTE 02/2022: Similar findings -Coronary calcium score July 2023:total coronary artery calcium score is 119, which places the patient in the 58th percentile Prostate cancer Dx 2025 Esophageal cancer Dx January 2026   PLAN Here for CPX -Td 2017 - s/p shingrex x2 -- had a Flu shot    -  Rec PNM 20, covid booster  -CCS: (-) Cscope 2010  , Dr Shana, Cologuard 01/2021 negative.  Cologuard neg  02/2024. -Prostate  cancer: Per urology - Available labs reviewed.  No  blood work today. - Lifestyle is very healthy. Other issues: Esophageal cancer: Recently diagnosed, undergoing workup before treatment.  Emotional support provided, he knows to call me anytime if needed. Will send a prescription for liquid Tylenol , he has some difficult time swallowing tablets.   Dermatitis: Occasional anorectal dermatitis, request a refill on triamcinolone , last time prescribed was 4 years ago.  RF sent Asthma: Well-controlled with Symbicort  as needed RTC 1 year      "

## 2024-09-29 NOTE — Patient Instructions (Addendum)
 Please read your instructions carefully.  I will send a prescription for liquid Tylenol  and a refill on triamcinolone .  Call me anytime if needed   Go to the front desk for the checkout Please make an appointment for a physical exam in 1 year

## 2024-09-29 NOTE — Telephone Encounter (Signed)
 Oral Oncology Patient Advocate Encounter   Received notification that prior authorization for lidocaine -prilocaine  (EMLA ) cream  is required.   PA submitted on 09/29/24 Key [id number] Status is pending     Lucie Lamer, CPhT [place]  [facility] Oncology Pharmacy Patient Advocate Specialist II [facility] [place] Phone: [phone]  Fax: [phone] [email or web address]

## 2024-09-29 NOTE — Assessment & Plan Note (Signed)
 Here for CPX -Td 2017 - s/p shingrex x2 -- had a Flu shot    -  Rec PNM 20, covid booster  -CCS: (-) Cscope 2010  , Dr Shana, Cologuard 01/2021 negative.  Cologuard neg  02/2024. -Prostate  cancer: Per urology - Available labs reviewed.  No blood work today. - Lifestyle is very healthy.

## 2024-09-29 NOTE — Progress Notes (Signed)
 Pharmacist Chemotherapy Monitoring - Initial Assessment    Anticipated start date: 10/09/24   The following has been reviewed per standard work regarding the patient's treatment regimen: The patient's diagnosis, treatment plan and drug doses, and organ/hematologic function Lab orders and baseline tests specific to treatment regimen  The treatment plan start date, drug sequencing, and pre-medications Prior authorization status  Patient's documented medication list, including drug-drug interaction screen and prescriptions for anti-emetics and supportive care specific to the treatment regimen The drug concentrations, fluid compatibility, administration routes, and timing of the medications to be used The patient's access for treatment and lifetime cumulative dose history, if applicable  The patient's medication allergies and previous infusion related reactions, if applicable   Changes made to treatment plan:  N/A  Follow up needed:  Pending authorization for treatment  and port placement sched for 1/21   Wilma Dollar, Pharm.D., CPP 09/29/2024@3 :32 PM

## 2024-09-29 NOTE — Telephone Encounter (Signed)
 Oral Oncology Patient Advocate Encounter  Prior Authorization for Emla  has been approved.    PA# [id number] Effective dates: 09/29/24 through 09/29/25    Lucie Lamer, CPhT [place]  [facility] Oncology Pharmacy Patient Advocate Specialist [facility] [place] Phone: [phone]  Fax: [phone] [email or web address]

## 2024-09-30 ENCOUNTER — Other Ambulatory Visit: Payer: Self-pay

## 2024-09-30 LAB — CANCER ANTIGEN 19-9: CA 19-9: 80 U/mL — ABNORMAL HIGH (ref 0–35)

## 2024-10-02 ENCOUNTER — Encounter
Admission: RE | Admit: 2024-10-02 | Discharge: 2024-10-02 | Disposition: A | Discharge status: Home / Self Care | Source: Ambulatory Visit | Attending: Hematology | Admitting: Hematology

## 2024-10-02 DIAGNOSIS — C155 Malignant neoplasm of lower third of esophagus: Secondary | ICD-10-CM | POA: Insufficient documentation

## 2024-10-02 LAB — GLUCOSE, CAPILLARY: Glucose-Capillary: 94 mg/dL (ref 70–99)

## 2024-10-02 MED ORDER — FLUDEOXYGLUCOSE F - 18 (FDG) INJECTION
9.2400 | Freq: Once | INTRAVENOUS | Status: AC | PRN
  Administered 2024-10-02: 9.24 via INTRAVENOUS

## 2024-10-03 ENCOUNTER — Other Ambulatory Visit (HOSPITAL_COMMUNITY): Payer: Self-pay | Admitting: Radiology

## 2024-10-03 ENCOUNTER — Inpatient Hospital Stay (HOSPITAL_BASED_OUTPATIENT_CLINIC_OR_DEPARTMENT_OTHER): Admitting: Hematology

## 2024-10-03 ENCOUNTER — Encounter (HOSPITAL_COMMUNITY): Payer: Self-pay | Admitting: Gastroenterology

## 2024-10-03 DIAGNOSIS — C61 Malignant neoplasm of prostate: Secondary | ICD-10-CM

## 2024-10-03 DIAGNOSIS — C155 Malignant neoplasm of lower third of esophagus: Secondary | ICD-10-CM

## 2024-10-03 MED ORDER — PANTOPRAZOLE SODIUM 40 MG PO PACK: 40.0000 mg | PACK | Freq: Every day | ORAL | 2 refills | Status: DC

## 2024-10-03 NOTE — Progress Notes (Signed)
 " [facility]   Telephone:([phone] Fax:([phone]) [phone]   Clinic Follow up Note   Patient Care Team: Amon Aloysius BRAVO, MD as PCP - General (Internal Medicine) Shana Steffan ORN, MD as Referring Physician (Gastroenterology) [facility] (Ophthalmology) Ardis Evalene CROME, RN as Oncology Nurse Navigator Lanny Callander, MD as Consulting Physician (Hematology and Oncology) 10/07/2024  I connected with Elspeth CROME Fam on 10/03/2024 at  3:40 PM EST by telephone and verified that I am speaking with the correct person using two identifiers.   I discussed the limitations, risks, security and privacy concerns of performing an evaluation and management service by telephone and the availability of in person appointments. I also discussed with the patient that there may be a patient responsible charge related to this service. The patient expressed understanding and agreed to proceed.   Patient's location:  Home  Provider's location:  Office    CHIEF COMPLAINT: f/u esophageal cancer   CURRENT THERAPY: Pending neoadjuvant chemotherapy FLOT and durvalumab   Oncology history Esophageal cancer (HCC) cTxN1M0,  - Diagnosed in January 2026.  Patient presents with progressive dysphagia for 3 weeks -Endoscopy on September 22, 2024 showed a distal esophageal mass, biopsy confirmed intramucosal adenocarcinoma. -CT and PET scan showed hypermetabolic esophageal tumor and periesophageal and gastroesophagea ligament adenopathy, no distant metastasis. -Recommend new adjuvant chemotherapy FLOT and durvalumab .  Assessment & Plan Malignant neoplasm of lower third of esophagus Newly diagnosed, localized esophageal carcinoma involving the lower third, with PET scan demonstrating a primary tumor and two regional lymph nodes involved. No evidence of distant metastasis. Disease is potentially curable and managed with a multidisciplinary approach.  - Reviewed PET scan confirming absence of distant metastasis. -  Planned tumor board discussion with surgical oncology prior to treatment initiation. - Scheduled port placement for chemotherapy administration. - Scheduled chemotherapy initiation next week. - Advised completion of oral surgery for dental cyst and antibiotic course prior to chemotherapy to reduce infection risk. - Confirmed administration of antiemetics (ondansetron , prochlorperazine ) and topical lidocaine  for port site care.  Dysphagia due to esophageal cancer Significant dysphagia and odynophagia secondary to esophageal tumor, including pain with swallowing liquids. Maintains oral intake with pain management and dietary modifications. - Advised continued use of acetaminophen  for pain, with caution to avoid masking fever during chemotherapy. - Discussed ibuprofen as an alternative, noting increased risk of tumor bleeding and reduced analgesic efficacy. - Recommended sucralfate  20-30 minutes prior to meals for symptomatic relief. - Encouraged intake of soft foods, thorough mastication, and use of nutritional supplements (Ensure, Boost, PowerAid). - Emphasized importance of maintaining adequate nutrition and hydration.  Gastroesophageal reflux disease GERD managed with omeprazole , effective for acid reflux and pain. Requested liquid formulation for ease of administration and agreed to switch to pantoprazole  liquid. - Discussed transition from omeprazole  capsule to pantoprazole  liquid formulation. - Prescribed pantoprazole  liquid once daily. - Sent prescription to [facility] pharmacy.  Plan - PET scan reviewed, which showed hypermetabolic esophageal tumor and regional adenopathy, no distant metastasis. - Will proceed with port placement tomorrow and start neoadjuvant chemotherapy FLOT and durvalumab  next week - Plan to present his case in thoracic tumor board later this week, and to review with his surgeon Dr. Shyrl about EUS.     SUMMARY OF ONCOLOGIC HISTORY: Oncology History   Prostate cancer (HCC)  04/27/2024 Initial Diagnosis   Prostate cancer (HCC)   08/13/2024 Genetic Testing   Negative genetics testing. Report date is 08/13/2024.  The CancerNext-Expanded gene panel offered by [facility] and includes sequencing, rearrangement, and RNA analysis for the following 77 genes: AIP, ALK, APC, ATM, BAP1, BARD1, BMPR1A, BRCA1, BRCA2, BRIP1, CDC73, CDH1, CDK4, CDKN1B, CDKN2A, CEBPA, CHEK2, CTNNA1, DDX41, DICER1, ETV6, FH, FLCN, GATA2, LZTR1, MAX, MBD4, MEN1, MET, MLH1, MSH2, MSH3, MSH6, MUTYH, NF1, NF2, NTHL1, PALB2, PHOX2B, PMS2, POT1, PRKAR1A, PTCH1, PTEN, RAD51C, RAD51D, RB1, RET, RPS20, RUNX1, SDHA, SDHAF2, SDHB, SDHC, SDHD, SMAD4, SMARCA4, SMARCB1, SMARCE1, STK11, SUFU, TMEM127, TP53, TSC1, TSC2, VHL, and WT1 (sequencing and deletion/duplication); AXIN2, CTNNA1, DDX41, EGFR, HOXB13, KIT, MBD4, MITF, MSH3, PDGFRA, POLD1 and POLE (sequencing only); EPCAM and GREM1 (deletion/duplication only). RNA data is routinely analyzed for use in variant interpretation for all genes.   Esophageal cancer (HCC)  09/28/2024 Initial Diagnosis   Esophageal cancer (HCC)   09/28/2024 Cancer Staging   Staging form: Esophagus - Adenocarcinoma, AJCC 8th Edition - Clinical stage from 09/28/2024: Stage III (cT3, cN1, cM0, GX) - Signed by Lanny Callander, MD on 09/28/2024 Histologic grading system: 3 grade system   10/09/2024 -  Chemotherapy   Patient is on Treatment Plan : GASTROESOPHAGEAL Durvalumab  q28d + FLOT q14d / Durvalumab  q28d       Discussed the use of AI scribe software for clinical note transcription with the patient, who gave verbal consent to proceed.  History of Present Illness Tommy Mcbride is a 69 year old male with newly diagnosed malignant neoplasm of the lower third of the esophagus who presents for oncology follow-up after PET scan and in preparation for chemoradiation.  Recent PET showed a hypermetabolic distal esophageal primary with two PET-avid regional lymph nodes  (adjacent to the esophagus and between the liver and stomach), without distant metastases, concordant with prior CT.  He has significant odynophagia and dysphagia to solids and liquids, with persistent pain even with liquid nutrition. He uses Tylenol  liquid 200 mg three to four times daily, sucralfate  before meals, omeprazole  daily, soft foods with thorough chewing, Ensure, chewable multivitamins, and PowerAid for hydration. He requested a liquid omeprazole  formulation for easier administration.  He is scheduled for oral surgery for a cyst at a prior root canal site, with a 5-day azithromycin course starting 2 days before the procedure. He anticipates increased oral pain and sutures post-procedure but currently tolerates oral discomfort.  He has ondansetron  and prochlorperazine  for anticipated chemotherapy-induced nausea and lidocaine  cream for port-site care. He asked about safe use of acetaminophen  and NSAIDs during chemotherapy. He is focused on maintaining adequate nutrition and hydration and is engaged in treatment planning.     REVIEW OF SYSTEMS:   Constitutional: Denies fevers, chills or abnormal weight loss Eyes: Denies blurriness of vision Ears, nose, mouth, throat, and face: Denies mucositis or sore throat Respiratory: Denies cough, dyspnea or wheezes Cardiovascular: Denies palpitation, chest discomfort or lower extremity swelling Gastrointestinal:  Denies nausea, heartburn or change in bowel habits Skin: Denies abnormal skin rashes Lymphatics: Denies new lymphadenopathy or easy bruising Neurological:Denies numbness, tingling or new weaknesses Behavioral/Psych: Mood is stable, no new changes  All other systems were reviewed with the patient and are negative.  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    Pneumonia    Prostate cancer [facility])     SURGICAL HISTORY: Past Surgical History:  Procedure Laterality Date  IR IMAGING GUIDED PORT INSERTION  10/04/2024   JOINT REPLACEMENT Left 08/2020   Knee replacement   REPLACEMENT TOTAL KNEE Left    ~08-2020   ROBOT ASSISTED LAPAROSCOPIC RADICAL PROSTATECTOMY N/A 04/27/2024   Procedure: PROSTATECTOMY, RADICAL, ROBOT-ASSISTED, LAPAROSCOPIC;  Surgeon: Renda Glance, MD;  Location: [facility];  Service: Urology;  Laterality: N/A;  LEVEL 2   TONSILLECTOMY     UMBILICAL HERNIA REPAIR N/A 04/27/2024   Procedure: REPAIR, HERNIA, UMBILICAL, ADULT;  Surgeon: Renda Glance, MD;  Location: [facility];  Service: Urology;  Laterality: N/A;    I have reviewed the social history and family history with the patient and they are unchanged from previous note.  ALLERGIES:  is allergic to penicillins.  MEDICATIONS:  Current Outpatient Medications  Medication Sig Dispense Refill   pantoprazole  sodium (PROTONIX ) 40 mg Place 40 mg into feeding tube daily. (Patient not taking: Reported on 10/06/2024) 30 packet 2   Acetaminophen  Extra Strength 500 MG/15ML LIQD Take 30 mLs by mouth 3 (three) times daily as needed. 1000 mL 1   azithromycin (ZITHROMAX) 1 g powder Take 1 g by mouth once.     budesonide -formoterol  (BREYNA ) 80-4.5 MCG/ACT inhaler Inhale 2 puffs into the lungs 2 (two) times daily as needed (respiratory issues.). (Patient not taking: Reported on 10/06/2024)     lidocaine -prilocaine  (EMLA ) cream Apply to affected area once 30 g 3   Multiple Vitamin (MULTIVITAMIN WITH MINERALS) TABS tablet Take 1 tablet by mouth daily.     Omega-3 Fatty Acids (SUSTAINABLE VEGAN OMEGA-3 PO) Take by mouth. (Patient not taking: Reported on 10/06/2024)     omeprazole  (PRILOSEC) 10 MG capsule Take 10 mg by mouth daily.     ondansetron  (ZOFRAN ) 8 MG tablet Take 1 tablet (8 mg total) by mouth every 8 (eight) hours as needed for nausea or vomiting. Start on the third day after  chemotherapy. 30 tablet 1   Policosanol 30 MG TABS Policosanol (Patient not taking: Reported on 10/06/2024)     POLICOSANOL PO Take by mouth. (Patient not taking: Reported on 10/06/2024)     prochlorperazine  (COMPAZINE ) 10 MG tablet Take 1 tablet (10 mg total) by mouth every 6 (six) hours as needed for nausea or vomiting. 30 tablet 1   sucralfate  (CARAFATE ) 1 GM/10ML suspension Take 10 mLs (1 g total) by mouth 4 (four) times daily -  with meals and at bedtime. 420 mL 3   tadalafil (CIALIS) 5 MG tablet Take 5 mg by mouth daily as needed for erectile dysfunction.     triamcinolone  cream (KENALOG ) 0.1 % Apply 1 Application topically 2 (two) times daily as needed. (Patient not taking: Reported on 10/06/2024) 45 g 2   No current facility-administered medications for this visit.    PHYSICAL EXAMINATION: Not performed   LABORATORY DATA:  I have reviewed the data as listed    Latest Ref Rng & Units 09/22/2024    5:10 PM 04/28/2024    4:36 AM 04/27/2024    1:45 PM  CBC  WBC 4.0 - 10.5 K/uL 6.0     Hemoglobin 13.0 - 17.0 g/dL 86.4  86.5  86.2   Hematocrit 39.0 - 52.0 % 40.5  40.2  43.0   Platelets 150.0 - 400.0 K/uL 259.0           Latest Ref Rng & Units 09/22/2024    5:10 PM 04/21/2024    8:27 AM 09/24/2023   10:50 AM  CMP  Glucose 70 - 99 mg/dL 82  92  86  BUN 6 - 23 mg/dL 14  16  21    Creatinine 0.40 - 1.50 mg/dL 8.96  8.94  8.98   Sodium 135 - 145 mEq/L 137  138  137   Potassium 3.5 - 5.1 mEq/L 3.9  3.7  4.0   Chloride 96 - 112 mEq/L 103  103  101   CO2 19 - 32 mEq/L 27  25  28    Calcium  8.4 - 10.5 mg/dL 8.7  9.1  9.2   Total Protein 6.0 - 8.3 g/dL 6.9   7.0   Total Bilirubin 0.2 - 1.2 mg/dL 1.0   1.2   Alkaline Phos 39 - 117 U/L 83   80   AST 5 - 37 U/L 21   26   ALT 3 - 53 U/L 20   26       RADIOGRAPHIC STUDIES: I have personally reviewed the radiological images as listed and agreed with the findings in the report. No results found.     I discussed the assessment and  treatment plan with the patient. The patient was provided an opportunity to ask questions and all were answered. The patient agreed with the plan and demonstrated an understanding of the instructions.   The patient was advised to call back or seek an in-person evaluation if the symptoms worsen or if the condition fails to improve as anticipated.  I provided 25 minutes of non face-to-face telephone visit time during this encounter, including review of chart and various tests results, discussions about plan of care and coordination of care plan.    Onita Mattock, MD 10/03/2024      "

## 2024-10-03 NOTE — Assessment & Plan Note (Signed)
 cTxN1M0,  - Diagnosed in January 2026.  Patient presents with progressive dysphagia for 3 weeks -Endoscopy on September 22, 2024 showed a distal esophageal mass, biopsy confirmed intramucosal adenocarcinoma. -CT and PET scan showed hypermetabolic esophageal tumor and periesophageal and gastroesophagea ligament adenopathy, no distant metastasis. -Recommend new adjuvant chemotherapy FLOT and durvalumab.

## 2024-10-04 ENCOUNTER — Other Ambulatory Visit (HOSPITAL_COMMUNITY): Payer: Self-pay

## 2024-10-04 ENCOUNTER — Ambulatory Visit: Admitting: Physician Assistant

## 2024-10-04 ENCOUNTER — Encounter: Payer: Self-pay | Admitting: Hematology

## 2024-10-04 ENCOUNTER — Telehealth (HOSPITAL_COMMUNITY): Payer: Self-pay

## 2024-10-04 ENCOUNTER — Encounter (HOSPITAL_COMMUNITY): Payer: Self-pay

## 2024-10-04 ENCOUNTER — Ambulatory Visit (HOSPITAL_COMMUNITY)
Admission: RE | Admit: 2024-10-04 | Discharge: 2024-10-04 | Disposition: A | Source: Ambulatory Visit | Attending: Hematology

## 2024-10-04 ENCOUNTER — Other Ambulatory Visit: Payer: Self-pay

## 2024-10-04 ENCOUNTER — Inpatient Hospital Stay (HOSPITAL_COMMUNITY): Admission: RE | Admit: 2024-10-04 | Discharge: 2024-10-04 | Attending: Hematology

## 2024-10-04 DIAGNOSIS — C155 Malignant neoplasm of lower third of esophagus: Secondary | ICD-10-CM | POA: Insufficient documentation

## 2024-10-04 DIAGNOSIS — C61 Malignant neoplasm of prostate: Secondary | ICD-10-CM

## 2024-10-04 HISTORY — PX: IR IMAGING GUIDED PORT INSERTION: IMG5740

## 2024-10-04 MED ORDER — SODIUM CHLORIDE 0.9 % IV SOLN: INTRAVENOUS | Status: DC

## 2024-10-04 MED ORDER — MIDAZOLAM HCL (PF) 2 MG/2ML IJ SOLN
INTRAMUSCULAR | Status: AC | PRN
  Administered 2024-10-04: 1 mg via INTRAVENOUS

## 2024-10-04 MED ORDER — HEPARIN SOD (PORK) LOCK FLUSH 100 UNIT/ML IV SOLN
500.0000 [IU] | Freq: Once | INTRAVENOUS | Status: AC
  Administered 2024-10-04: 500 [IU] via INTRAVENOUS

## 2024-10-04 MED ORDER — HEPARIN SOD (PORK) LOCK FLUSH 100 UNIT/ML IV SOLN
INTRAVENOUS | Status: AC
  Filled 2024-10-04: qty 5

## 2024-10-04 MED ORDER — FENTANYL CITRATE (PF) 100 MCG/2ML IJ SOLN
INTRAMUSCULAR | Status: AC
  Filled 2024-10-04: qty 2

## 2024-10-04 MED ORDER — MIDAZOLAM HCL 2 MG/2ML IJ SOLN
INTRAMUSCULAR | Status: AC
  Filled 2024-10-04: qty 2

## 2024-10-04 MED ORDER — FENTANYL CITRATE (PF) 100 MCG/2ML IJ SOLN
INTRAMUSCULAR | Status: AC | PRN
  Administered 2024-10-04: 50 ug via INTRAVENOUS

## 2024-10-04 MED ORDER — LIDOCAINE-EPINEPHRINE 1 %-1:100000 IJ SOLN
20.0000 mL | Freq: Once | INTRAMUSCULAR | Status: AC
  Administered 2024-10-04: 20 mL via INTRADERMAL

## 2024-10-04 MED ORDER — LIDOCAINE-EPINEPHRINE 1 %-1:100000 IJ SOLN
INTRAMUSCULAR | Status: AC
  Filled 2024-10-04: qty 20

## 2024-10-04 NOTE — Procedures (Signed)
 Vascular and Interventional Radiology Procedure Note  Patient: Tommy Mcbride DOB: 03/06/1956 Medical Record Number: [id number] Note Date/Time: 10/04/24 2:28 PM   Performing Physician: Thom Hall, MD Assistant(s): None  Diagnosis: Esophageal CA  Procedure: PORT PLACEMENT  Anesthesia: Conscious Sedation Complications: None Estimated Blood Loss: Minimal  Findings:  Successful right-sided port placement, with the tip of the catheter in the proximal right atrium.  Plan: Catheter ready for use.  See detailed procedure note with images in PACS. The patient tolerated the procedure well without incident or complication and was returned to Recovery in stable condition.    Thom Hall, MD Vascular and Interventional Radiology Specialists [facility]   Pager. [phone] Clinic. [phone]

## 2024-10-04 NOTE — Discharge Instructions (Signed)

## 2024-10-04 NOTE — Consult Note (Signed)
 "     Chief Complaint: Patient was seen in consultation today for gastroesophageal cancer  Referring Physician(s): Feng,Yan  Supervising Physician: Hughes Simmonds  Patient Status: [facility] - Out-pt  History of Present Illness: Tommy Mcbride is a 69 y.o. male with no relevant past medical history who presents with recent diagnosis of gastroesophageal cancer. He is in need of durable venous access for upcoming chemotherapy.   He presents to [facility] Radiology in his usual state of health today.  He has been NPO.  He does not take blood thinners.  He does have post-procedure care and transportation arranged.   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    Pneumonia    Prostate cancer [facility])     Past Surgical History:  Procedure Laterality Date   JOINT REPLACEMENT Left 08/2020   Knee replacement   REPLACEMENT TOTAL KNEE Left    ~08-2020   ROBOT ASSISTED LAPAROSCOPIC RADICAL PROSTATECTOMY N/A 04/27/2024   Procedure: PROSTATECTOMY, RADICAL, ROBOT-ASSISTED, LAPAROSCOPIC;  Surgeon: Renda Glance, MD;  Location: [facility]S;  Service: Urology;  Laterality: N/A;  LEVEL 2   TONSILLECTOMY     UMBILICAL HERNIA REPAIR N/A 04/27/2024   Procedure: REPAIR, HERNIA, UMBILICAL, ADULT;  Surgeon: Renda Glance, MD;  Location: [facility];  Service: Urology;  Laterality: N/A;    Allergies: Penicillins  Medications: Prior to Admission medications  Medication Sig Start Date End Date Taking? Authorizing Provider  Acetaminophen  Extra Strength 500 MG/15ML LIQD Take 30 mLs by mouth 3 (three) times daily as needed. 09/29/24  Yes Paz, Jose E, MD  budesonide -formoterol  (BREYNA ) 80-4.5 MCG/ACT inhaler Inhale 2 puffs into the lungs 2 (two) times daily as needed (respiratory issues.).   Yes [provider]  Multiple Vitamin (MULTIVITAMIN WITH  MINERALS) TABS tablet Take 1 tablet by mouth daily.   Yes [provider]  Omega-3 Fatty Acids (SUSTAINABLE VEGAN OMEGA-3 PO) Take by mouth.   Yes [provider]  pantoprazole  sodium (PROTONIX ) 40 mg Place 40 mg into feeding tube daily. 10/03/24  Yes Lanny Callander, MD  Policosanol 30 MG TABS Policosanol   Yes [provider]  POLICOSANOL PO Take by mouth.   Yes [provider]  sucralfate  (CARAFATE ) 1 GM/10ML suspension Take 10 mLs (1 g total) by mouth 4 (four) times daily -  with meals and at bedtime. 09/25/24  Yes Pyrtle, Gordy HERO, MD  triamcinolone  cream (KENALOG ) 0.1 % Apply 1 Application topically 2 (two) times daily as needed. 09/29/24  Yes Amon Aloysius BRAVO, MD  lidocaine -prilocaine  (EMLA ) cream Apply to affected area once 09/28/24   Lanny Callander, MD  ondansetron  (ZOFRAN ) 8 MG tablet Take 1 tablet (8 mg total) by mouth every 8 (eight) hours as needed for nausea or vomiting. Start on the third day after chemotherapy. 09/28/24   Lanny Callander, MD  prochlorperazine  (COMPAZINE ) 10 MG tablet Take 1 tablet (10 mg total) by mouth every 6 (six) hours as needed for nausea or vomiting. 09/28/24   Lanny Callander, MD     Family History  Problem Relation Age of Onset   Breast cancer Mother 24   Arthritis Mother    Cancer Mother    Stroke Father 77   Hyperlipidemia Father    Breast cancer Maternal Aunt 55  CAD Neg Hx    Diabetes Neg Hx    Colon cancer Neg Hx    Prostate cancer Neg Hx    Stomach cancer Neg Hx    Rectal cancer Neg Hx    Esophageal cancer Neg Hx     Social History   Socioeconomic History   Marital status: Married    Spouse name: Not on file   Number of children: 2   Years of education: Not on file   Highest education level: Bachelor's degree (e.g., BA, AB, BS)  Occupational History   Occupation: tennis pro  Tobacco Use   Smoking status: Never   Smokeless tobacco: Never  Vaping Use   Vaping status: Never Used  Substance and Sexual Activity   Alcohol use: Yes     Alcohol/week: 4.0 standard drinks of alcohol    Types: 4 Cans of beer per week    Comment: few beers a week   Drug use: Never   Sexual activity: Yes    Birth control/protection: Condom  Other Topics Concern   Not on file  Social History Narrative   Tennis pro, very active.   2 daughters    Shanda  MD   Kathern: grad school psychology    Social Drivers of Health   Tobacco Use: Low Risk (10/04/2024)   Patient History    Smoking Tobacco Use: Never    Smokeless Tobacco Use: Never    Passive Exposure: Not on file  Financial Resource Strain: Low Risk (08/07/2024)   Overall Financial Resource Strain (CARDIA)    Difficulty of Paying Living Expenses: Not hard at all  Food Insecurity: No Food Insecurity (09/26/2024)   Epic    Worried About Programme Researcher, Broadcasting/film/video in the Last Year: Never true    Ran Out of Food in the Last Year: Never true  Transportation Needs: No Transportation Needs (09/26/2024)   Epic    Lack of Transportation (Medical): No    Lack of Transportation (Non-Medical): No  Physical Activity: Sufficiently Active (08/07/2024)   Exercise Vital Sign    Days of Exercise per Week: 5 days    Minutes of Exercise per Session: 60 min  Stress: No Stress Concern Present (08/07/2024)   [employer] of Occupational Health - Occupational Stress Questionnaire    Feeling of Stress: Not at all  Social Connections: Moderately Integrated (08/07/2024)   Social Connection and Isolation Panel    Frequency of Communication with Friends and Family: Twice a week    Frequency of Social Gatherings with Friends and Family: More than three times a week    Attends Religious Services: Never    Database Administrator or Organizations: Yes    Attends Banker Meetings: Patient declined    Marital Status: Married  Depression (PHQ2-9): Low Risk (09/29/2024)   Depression (PHQ2-9)    PHQ-2 Score: 0  Alcohol Screen: Low Risk (08/07/2024)   Alcohol Screen    Last Alcohol Screening  Score (AUDIT): 3  Housing: Low Risk (09/26/2024)   Epic    Unable to Pay for Housing in the Last Year: No    Number of Times Moved in the Last Year: 0    Homeless in the Last Year: No  Utilities: Not At Risk (09/26/2024)   Epic    Threatened with loss of utilities: No  Health Literacy: Adequate Health Literacy (03/10/2024)   B1300 Health Literacy    Frequency of need for help with medical instructions: Never     Review  of Systems: A 12 point ROS discussed and pertinent positives are indicated in the HPI above.  All other systems are negative.  Review of Systems  Constitutional:  Negative for fatigue and fever.  Cardiovascular:  Negative for chest pain.  Gastrointestinal:  Negative for abdominal pain, nausea and vomiting.  Musculoskeletal:  Negative for back pain.  Psychiatric/Behavioral:  Negative for behavioral problems and confusion.     Vital Signs: BP (!) 136/95 (BP Location: Right Arm)   Pulse (!) 107   Temp 98.2 F (36.8 C) (Oral)   Resp 16   Ht 6' (1.829 m)   Wt 175 lb 8 oz (79.6 kg)   SpO2 96%   BMI 23.80 kg/m   Physical Exam Vitals and nursing note reviewed.  Constitutional:      General: He is not in acute distress.    Appearance: Normal appearance. He is not ill-appearing.  HENT:     Mouth/Throat:     Mouth: Mucous membranes are moist.     Pharynx: Oropharynx is clear.  Cardiovascular:     Rate and Rhythm: Normal rate and regular rhythm.  Pulmonary:     Effort: Pulmonary effort is normal. No respiratory distress.     Breath sounds: Normal breath sounds.  Abdominal:     General: Abdomen is flat.     Palpations: Abdomen is soft.  Skin:    General: Skin is warm and dry.  Neurological:     General: No focal deficit present.     Mental Status: He is alert and oriented to person, place, and time. Mental status is at baseline.  Psychiatric:        Mood and Affect: Mood normal.        Behavior: Behavior normal.        Thought Content: Thought content  normal.        Judgment: Judgment normal.      MD Evaluation Airway: WNL Heart: WNL Abdomen: WNL Chest/ Lungs: WNL ASA  Classification: 3 Mallampati/Airway Score: Two   Imaging: NM PET Image Initial (PI) Skull Base To Thigh Result Date: 10/02/2024 CLINICAL DATA:  Subsequent treatment strategy for esophageal cancer. EXAM: NUCLEAR MEDICINE PET SKULL BASE TO THIGH TECHNIQUE: 9.2 mCi F-18 FDG was injected intravenously. Full-ring PET imaging was performed from the skull base to thigh after the radiotracer. CT data was obtained and used for attenuation correction and anatomic localization. Fasting blood glucose: 94 mg/dl COMPARISON:  CT chest abdomen pelvis 09/25/2024, PET 02/08/2024. FINDINGS: Mediastinal blood pool activity: SUV max 2.6 Liver activity: SUV max NA NECK: No abnormal hypermetabolism. Incidental CT findings: None. CHEST: Distal esophageal mass is somewhat difficult to measure, approximately 3.1 x 4.9 cm, similar but with markedly increased hypermetabolism, SUV max 13.6, previously 4.9. Hypermetabolic left distal periesophageal lymph node measures 1.3 cm (6/83). Incidental CT findings: Atherosclerotic calcification of the aorta, aortic valve and coronary arteries. Heart is at the upper limits of normal in size to mildly enlarged. No pericardial or pleural effusion. ABDOMEN/PELVIS: Hypermetabolic gastroesophageal ligament lymph node is new in the interval, measuring 2.1 cm in short axis (6/92), SUV max 11.7. No additional abnormal hypermetabolism. Incidental CT findings: Low-attenuation lesions in the kidneys. No specific follow-up necessary. Tiny bilateral renal stones. Prostatectomy. SKELETON: No abnormal hypermetabolism. Incidental CT findings: Degenerative changes in the spine. IMPRESSION: 1. Increasingly hypermetabolic distal esophageal mass with new hypermetabolic distal periesophageal and gastrohepatic ligament lymph nodes, compatible with disease progression. No evidence of distant  metastatic disease. 2. Tiny bilateral renal stones.  3. Aortic atherosclerosis (ICD10-I70.0). Coronary artery calcification. Electronically Signed   By: Newell Eke M.D.   On: 10/02/2024 09:55   CT CHEST ABDOMEN PELVIS W CONTRAST Result Date: 09/25/2024 CLINICAL DATA:  Esophageal cancer staging.  * Tracking Code: BO * EXAM: CT CHEST, ABDOMEN, AND PELVIS WITH CONTRAST TECHNIQUE: Multidetector CT imaging of the chest, abdomen and pelvis was performed following the standard protocol during bolus administration of intravenous contrast. RADIATION DOSE REDUCTION: This exam was performed according to the departmental dose-optimization program which includes automated exposure control, adjustment of the mA and/or kV according to patient size and/or use of iterative reconstruction technique. CONTRAST:  75mL OMNIPAQUE  IOHEXOL  350 MG/ML SOLN COMPARISON:  Nuclear medicine PET dated 02/08/2024 FINDINGS: CT CHEST FINDINGS Cardiovascular: Normal heart size. No significant pericardial fluid/thickening. Great vessels are normal in course and caliber. No central pulmonary emboli. Coronary artery calcifications. Mediastinum/Nodes: Imaged thyroid  gland without nodules meeting criteria for imaging follow-up by size. Mural thickening of the lower esophagus spanning 6.1 cm above the gastroesophageal junction (5:54). Left paraesophageal lymph node measures 10 mm (2:55). Lungs/Pleura: The central airways are patent. 5 mm ground-glass nodule in the left lower lobe (4:113), unchanged from 02/08/2024. Peripheral 3 mm right lower lobe calcified granuloma (4:125). No focal consolidation. No pneumothorax. No pleural effusion. Musculoskeletal: No acute or abnormal lytic or blastic osseous lesions. CT ABDOMEN PELVIS FINDINGS Hepatobiliary: No focal hepatic lesions. No intra or extrahepatic biliary ductal dilation. Normal gallbladder. Pancreas: No focal lesions or main ductal dilation. Spleen: Normal in size without focal abnormality.  Adrenals/Urinary Tract: No adrenal nodules. No suspicious renal mass or hydronephrosis. 4 mm nonobstructing stone in the left lower pole. Bilateral simple/minimally complicated cysts measuring up to 3.5 cm in the left upper lobe (2:70). No specific follow-up imaging recommended. No focal bladder wall thickening. Stomach/Bowel: Normal appearance of the stomach. No evidence of bowel wall thickening, distention, or inflammatory changes. Colonic diverticulosis without acute diverticulitis. Normal appendix. Vascular/Lymphatic: Aortic atherosclerosis. 2.3 cm right paracardial lymph node (2:64). Reproductive: Prostatectomy. Other: No free fluid, fluid collection, or free air. Musculoskeletal: No acute or abnormal lytic or blastic osseous findings. Peripherally sclerotic rounded foci within the right iliac (2:104, 105), unchanged from 02/08/2024. IMPRESSION: 1. Mural thickening of the lower esophagus spanning 6.1 cm above the gastroesophageal junction, consistent with known esophageal cancer. 2. Left paraesophageal and right paracardial lymph nodes measuring up to 10 mm, suspicious for nodal metastases. 3. No evidence of metastatic disease in the abdomen or pelvis. 4. Unchanged 5 mm ground-glass nodule in the left lower lobe, indeterminate. 5.  Aortic Atherosclerosis (ICD10-I70.0). Electronically Signed   By: Limin  Xu M.D.   On: 09/25/2024 14:53    Labs:  CBC: Recent Labs    04/21/24 0827 04/27/24 1345 04/28/24 0436 09/22/24 1710  WBC 7.2  --   --  6.0  HGB 14.6 13.7 13.4 13.5  HCT 45.5 43.0 40.2 40.5  PLT 256  --   --  259.0    COAGS: No results for input(s): INR, APTT in the last 8760 hours.  BMP: Recent Labs    04/21/24 0827 09/22/24 1710  NA 138 137  K 3.7 3.9  CL 103 103  CO2 25 27  GLUCOSE 92 82  BUN 16 14  CALCIUM 9.1 8.7  CREATININE 1.05 1.03  GFRNONAA >60  --     LIVER FUNCTION TESTS: Recent Labs    09/22/24 1710  BILITOT 1.0  AST 21  ALT 20  ALKPHOS 83  PROT 6.9  ALBUMIN 4.2    TUMOR MARKERS: Recent Labs    09/22/24 1710  CEA <2.0    Assessment and Plan: Patient with past medical history of DJD, remote prostate cancer presents with complaint of gastroesophageal cancer.  IR consulted for Port-A-Cath placement at the request of Dr. Lanny. Case reviewed by Dr. Hughes who approves patient for procedure.  Patient presents today in their usual state of health.  He has been NPO and is not currently on blood thinners.   Risks and benefits of image guided port-a-catheter placement was discussed with the patient including, but not limited to bleeding, infection, pneumothorax, or fibrin sheath development and need for additional procedures.  All of the patient's questions were answered, patient is agreeable to proceed. Consent signed and in chart.   Thank you for this interesting consult.  I greatly enjoyed meeting Tommy Mcbride and look forward to participating in their care.  A copy of this report was sent to the requesting provider on this date.  Electronically Signed: Bence Trapp Sue-Ellen Rajan Burgard, PA 10/04/2024, 1:37 PM   I spent a total of  30 Minutes   in face to face in clinical consultation, greater than 50% of which was counseling/coordinating care for gastroesophageal cancer.  "

## 2024-10-04 NOTE — Telephone Encounter (Signed)
 Oral Oncology Patient Advocate Encounter  Prior Authorization for Pantoprazole  Packets has been approved.    PA# [id number] Effective dates: 10/04/2024 through 10/04/2025  Patients co-pay is $316.91.   Patient  has been notified via MyChart  Charlott Hamilton,  CPhT-Adv  she/her/hers [facility]  [facility] Pharmacy Technician Patient Advocate Specialist III [facility] Phone: [phone]  Fax: [phone] [email or web address]

## 2024-10-04 NOTE — Telephone Encounter (Signed)
 Oral Oncology Patient Advocate Encounter   Received notification that prior authorization for Pantoprazole  packets is required.   PA submitted on 10/04/2024 Key [id number] Status is pending      Charlott Hamilton,  CPhT-Adv  she/her/hers [facility]  [facility] Pharm[facility] Services Pharmacy Technician Patient Advocate Specialist III [facility] Phone: [phone]  Fax: [phone] [email or web address]

## 2024-10-04 NOTE — Sedation Documentation (Signed)
 RN Jacquez Sheetz pulled 2 mg Versed  and 100 mcg Fentanyl  in Ir room. Pt. Received 2 mg Versed  and 100 mcg Fentanyl  throughout the procedure.

## 2024-10-05 ENCOUNTER — Other Ambulatory Visit: Payer: Self-pay

## 2024-10-05 ENCOUNTER — Inpatient Hospital Stay

## 2024-10-05 NOTE — Progress Notes (Unsigned)
 "     [address] [place] [address]             [phone]                   Tommy Mcbride [facility] Medical Record #[id number] Date of Birth: 1956/09/04  Referring: Tommy Mcbride HERO, MD Primary Care: Tommy Aloysius BRAVO, MD Primary Cardiologist: None  Chief Complaint:   No chief complaint on file.   History of Present Illness:    Tommy Mcbride 69 y.o. male ***    Smoking Hx: ***   Zubrod Score: At the time of surgery this patients most appropriate activity status/level should be described as: []     0    Normal activity, no symptoms []     1    Restricted in physical strenuous activity but ambulatory, able to do out light work []     2    Ambulatory and capable of self care, unable to do work activities, up and about               >50 % of waking hours                              []     3    Only limited self care, in bed greater than 50% of waking hours []     4    Completely disabled, no self care, confined to bed or chair []     5    Moribund   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    Pneumonia    Prostate cancer [facility])     Past Surgical History:  Procedure Laterality Date   IR IMAGING GUIDED PORT INSERTION  10/04/2024   JOINT REPLACEMENT Left 08/2020   Knee replacement   REPLACEMENT TOTAL KNEE Left    ~08-2020   ROBOT ASSISTED LAPAROSCOPIC RADICAL PROSTATECTOMY N/A 04/27/2024   Procedure: PROSTATECTOMY, RADICAL, ROBOT-ASSISTED, LAPAROSCOPIC;  Surgeon: Tommy Glance, MD;  [facility];  Service: Urology;  Laterality: N/A;  LEVEL 2   TONSILLECTOMY     UMBILICAL HERNIA REPAIR N/A 04/27/2024   Procedure: REPAIR, HERNIA, UMBILICAL, ADULT;  Surgeon: Tommy Glance, MD;  Location[facility];  Service: Urology;  Laterality: N/A;    Family History  Problem Relation Age  of Onset   Breast cancer Mother 36   Arthritis Mother    Cancer Mother    Stroke Father 54   Hyperlipidemia Father    Breast cancer Maternal Aunt 63   CAD Neg Hx    Diabetes Neg Hx    Colon cancer Neg Hx    Prostate cancer Neg Hx    Stomach cancer Neg Hx    Rectal cancer Neg Hx    Esophageal cancer Neg Hx      Tobacco Use History[1]  Social History   Substance and Sexual Activity  Alcohol Use Yes   Alcohol/week: 4.0 standard drinks of alcohol   Types: 4 Cans of beer per week   Comment: few beers a week     Allergies[2]  Current Outpatient Medications  Medication Sig Dispense Refill   Acetaminophen  Extra  Strength 500 MG/15ML LIQD Take 30 mLs by mouth 3 (three) times daily as needed. 1000 mL 1   budesonide -formoterol  (BREYNA ) 80-4.5 MCG/ACT inhaler Inhale 2 puffs into the lungs 2 (two) times daily as needed (respiratory issues.).     lidocaine -prilocaine  (EMLA ) cream Apply to affected area once 30 g 3   Multiple Vitamin (MULTIVITAMIN WITH MINERALS) TABS tablet Take 1 tablet by mouth daily.     Omega-3 Fatty Acids (SUSTAINABLE VEGAN OMEGA-3 PO) Take by mouth.     ondansetron  (ZOFRAN ) 8 MG tablet Take 1 tablet (8 mg total) by mouth every 8 (eight) hours as needed for nausea or vomiting. Start on the third day after chemotherapy. 30 tablet 1   pantoprazole  sodium (PROTONIX ) 40 mg Place 40 mg into feeding tube daily. 30 packet 2   Policosanol 30 MG TABS Policosanol     POLICOSANOL PO Take by mouth.     prochlorperazine  (COMPAZINE ) 10 MG tablet Take 1 tablet (10 mg total) by mouth every 6 (six) hours as needed for nausea or vomiting. 30 tablet 1   sucralfate  (CARAFATE ) 1 GM/10ML suspension Take 10 mLs (1 g total) by mouth 4 (four) times daily -  with meals and at bedtime. 420 mL 3   triamcinolone  cream (KENALOG ) 0.1 % Apply 1 Application topically 2 (two) times daily as needed. 45 g 2   No current facility-administered medications for this visit.    ROS   PHYSICAL  EXAMINATION: There were no vitals taken for this visit. Physical Exam  Diagnostic Studies & Laboratory data:    CT Scan: *** PET/CT: *** EGD/EUS: *** Manometry: *** Path: *** Radiation Hx: ***     I have independently reviewed the above radiology studies  and reviewed the findings with the patient.   Recent Lab Findings: Lab Results  Component Value Date   WBC 6.0 09/22/2024   HGB 13.5 09/22/2024   HCT 40.5 09/22/2024   PLT 259.0 09/22/2024   GLUCOSE 82 09/22/2024   CHOL 249 (H) 09/24/2023   TRIG 60.0 09/24/2023   HDL 74.70 09/24/2023   LDLCALC 162 (H) 09/24/2023   ALT 20 09/22/2024   AST 21 09/22/2024   NA 137 09/22/2024   K 3.9 09/22/2024   CL 103 09/22/2024   CREATININE 1.03 09/22/2024   BUN 14 09/22/2024   CO2 27 09/22/2024   TSH 1.18 07/25/2021       Problem List: ***  Assessment / Plan:   ***     I  spent {CHL ONC TIME VISIT - DTPQU:[id number]} with the patient face to face counseling and coordination of care.    Tommy Mcbride 10/05/2024 5:04 PM           [1]  Social History Tobacco Use  Smoking Status Never  Smokeless Tobacco Never  [2]  Allergies Allergen Reactions   Penicillins Other (See Comments)    childhood reaction   "

## 2024-10-06 ENCOUNTER — Ambulatory Visit
Attending: Thoracic Surgery (Cardiothoracic Vascular Surgery) | Admitting: Thoracic Surgery (Cardiothoracic Vascular Surgery)

## 2024-10-06 ENCOUNTER — Encounter: Payer: Self-pay | Admitting: Thoracic Surgery (Cardiothoracic Vascular Surgery)

## 2024-10-06 ENCOUNTER — Encounter: Payer: Self-pay | Admitting: Hematology

## 2024-10-06 VITALS — BP 144/81 | HR 103 | Resp 20 | Ht 72.0 in | Wt 172.8 lb

## 2024-10-06 DIAGNOSIS — C155 Malignant neoplasm of lower third of esophagus: Secondary | ICD-10-CM | POA: Diagnosis not present

## 2024-10-06 NOTE — Progress Notes (Signed)
 The proposed treatment discussed in conference is for discussion purpose only and is not a binding recommendation.  The patients have not been physically examined, or presented with their treatment options.  Therefore, final treatment plans cannot be decided.

## 2024-10-06 NOTE — Telephone Encounter (Signed)
 Oral Oncology Patient Advocate Encounter    Covered formulary alternatives that may offer a cheaper copay     Charlott Hamilton,  CPhT-Adv  she/her/hers [facility]  [facility] Pharmacy Technician Patient Advocate Specialist III [facility] Phone: [phone]  Fax: [phone] [email or web address]

## 2024-10-07 ENCOUNTER — Encounter: Payer: Self-pay | Admitting: Hematology

## 2024-10-09 ENCOUNTER — Telehealth: Payer: Self-pay | Admitting: Hematology

## 2024-10-09 ENCOUNTER — Inpatient Hospital Stay

## 2024-10-09 ENCOUNTER — Inpatient Hospital Stay: Admitting: Hematology

## 2024-10-09 NOTE — Telephone Encounter (Signed)
 Rescheduled appointments due to cancer center opening up at 10am. Talked with the patient and he is aware of the changes made to his upcoming appointments.

## 2024-10-10 ENCOUNTER — Inpatient Hospital Stay: Admitting: Nutrition

## 2024-10-10 ENCOUNTER — Inpatient Hospital Stay

## 2024-10-10 ENCOUNTER — Inpatient Hospital Stay: Admitting: Hematology

## 2024-10-10 ENCOUNTER — Encounter: Payer: Self-pay | Admitting: Nurse Practitioner

## 2024-10-10 ENCOUNTER — Inpatient Hospital Stay: Admitting: Nurse Practitioner

## 2024-10-10 ENCOUNTER — Other Ambulatory Visit: Payer: Self-pay

## 2024-10-10 VITALS — BP 119/89 | HR 92 | Temp 98.5°F | Resp 12 | Wt 166.2 lb

## 2024-10-10 DIAGNOSIS — C155 Malignant neoplasm of lower third of esophagus: Secondary | ICD-10-CM

## 2024-10-10 LAB — CMP (CANCER CENTER ONLY)
ALT: 16 U/L (ref 0–44)
AST: 22 U/L (ref 15–41)
Albumin: 4.4 g/dL (ref 3.5–5.0)
Alkaline Phosphatase: 96 U/L (ref 38–126)
Anion gap: 12 (ref 5–15)
BUN: 15 mg/dL (ref 8–23)
CO2: 26 mmol/L (ref 22–32)
Calcium: 9.3 mg/dL (ref 8.9–10.3)
Chloride: 101 mmol/L (ref 98–111)
Creatinine: 1 mg/dL (ref 0.61–1.24)
GFR, Estimated: >60 mL/min
Glucose, Bld: 122 mg/dL — ABNORMAL HIGH (ref 70–99)
Potassium: 3.7 mmol/L (ref 3.5–5.1)
Sodium: 138 mmol/L (ref 135–145)
Total Bilirubin: 0.6 mg/dL (ref 0.0–1.2)
Total Protein: 7.3 g/dL (ref 6.5–8.1)

## 2024-10-10 LAB — CBC WITH DIFFERENTIAL (CANCER CENTER ONLY)
Abs Immature Granulocytes: 0.01 10*3/uL (ref 0.00–0.07)
Basophils Absolute: 0.1 10*3/uL (ref 0.0–0.1)
Basophils Relative: 1 %
Eosinophils Absolute: 0.1 10*3/uL (ref 0.0–0.5)
Eosinophils Relative: 2 %
HCT: 40 % (ref 39.0–52.0)
Hemoglobin: 13.4 g/dL (ref 13.0–17.0)
Immature Granulocytes: 0 %
Lymphocytes Relative: 19 %
Lymphs Abs: 1.2 10*3/uL (ref 0.7–4.0)
MCH: 28.9 pg (ref 26.0–34.0)
MCHC: 33.5 g/dL (ref 30.0–36.0)
MCV: 86.2 fL (ref 80.0–100.0)
Monocytes Absolute: 0.7 10*3/uL (ref 0.1–1.0)
Monocytes Relative: 12 %
Neutro Abs: 4.1 10*3/uL (ref 1.7–7.7)
Neutrophils Relative %: 66 %
Platelet Count: 237 10*3/uL (ref 150–400)
RBC: 4.64 MIL/uL (ref 4.22–5.81)
RDW: 13.4 % (ref 11.5–15.5)
WBC Count: 6.1 10*3/uL (ref 4.0–10.5)
nRBC: 0 % (ref 0.0–0.2)

## 2024-10-10 LAB — T4, FREE: Free T4: 1.48 ng/dL (ref 0.80–2.00)

## 2024-10-10 LAB — TSH: TSH: 0.823 u[IU]/mL (ref 0.350–4.500)

## 2024-10-10 MED ORDER — PALONOSETRON HCL INJECTION 0.25 MG/5ML
0.2500 mg | Freq: Once | INTRAVENOUS | Status: AC
  Administered 2024-10-10: 0.25 mg via INTRAVENOUS
  Filled 2024-10-10: qty 5

## 2024-10-10 MED ORDER — DEXTROSE 5 % IV SOLN: INTRAVENOUS | Status: DC

## 2024-10-10 MED ORDER — SODIUM CHLORIDE 0.9 % IV SOLN: INTRAVENOUS | Status: DC

## 2024-10-10 MED ORDER — SODIUM CHLORIDE 0.9 % IV SOLN
50.0000 mg/m2 | Freq: Once | INTRAVENOUS | Status: AC
  Administered 2024-10-10: 101 mg via INTRAVENOUS
  Filled 2024-10-10: qty 10.1

## 2024-10-10 MED ORDER — SODIUM CHLORIDE 0.9 % IV SOLN
2600.0000 mg/m2 | INTRAVENOUS | Status: DC
  Administered 2024-10-10: 5000 mg via INTRAVENOUS
  Filled 2024-10-10: qty 100

## 2024-10-10 MED ORDER — SODIUM CHLORIDE 0.9 % IV SOLN
1500.0000 mg | Freq: Once | INTRAVENOUS | Status: AC
  Administered 2024-10-10: 1500 mg via INTRAVENOUS
  Filled 2024-10-10: qty 30

## 2024-10-10 MED ORDER — SODIUM CHLORIDE 0.9 % IV SOLN: Freq: Once | INTRAVENOUS | Status: AC

## 2024-10-10 MED ORDER — LEUCOVORIN CALCIUM INJECTION 350 MG
200.0000 mg/m2 | Freq: Once | INTRAVENOUS | Status: AC
  Administered 2024-10-10: 404 mg via INTRAVENOUS
  Filled 2024-10-10: qty 20.2

## 2024-10-10 MED ORDER — OXALIPLATIN CHEMO INJECTION 100 MG/20ML
85.0000 mg/m2 | Freq: Once | INTRAVENOUS | Status: AC
  Administered 2024-10-10: 170 mg via INTRAVENOUS
  Filled 2024-10-10: qty 34

## 2024-10-10 NOTE — Patient Instructions (Signed)
 [facility] - A DEPT OF [place]  Discharge Instructions: Thank you for choosing [facility] to provide your oncology and hematology care.   If you have a lab appointment with the Cancer Center, please go directly to the Cancer Center and check in at the registration area.   Wear comfortable clothing and clothing appropriate for easy access to any Portacath or PICC line.   We strive to give you quality time with your provider. You may need to reschedule your appointment if you arrive late (15 or more minutes).  Arriving late affects you and other patients whose appointments are after yours.  Also, if you miss three or more appointments without notifying the office, you may be dismissed from the clinic at the providers discretion.      For prescription refill requests, have your pharmacy contact our office and allow 72 hours for refills to be completed.    Today you received the following chemotherapy and/or immunotherapy agents: Durvalumab , Docetaxel , Oxaliplatin , Leucovorin , Fluorouracil       To help prevent nausea and vomiting after your treatment, we encourage you to take your nausea medication as directed.  BELOW ARE SYMPTOMS THAT SHOULD BE REPORTED IMMEDIATELY: *FEVER GREATER THAN 100.4 F (38 C) OR HIGHER *CHILLS OR SWEATING *NAUSEA AND VOMITING THAT IS NOT CONTROLLED WITH YOUR NAUSEA MEDICATION *UNUSUAL SHORTNESS OF BREATH *UNUSUAL BRUISING OR BLEEDING *URINARY PROBLEMS (pain or burning when urinating, or frequent urination) *BOWEL PROBLEMS (unusual diarrhea, constipation, pain near the anus) TENDERNESS IN MOUTH AND THROAT WITH OR WITHOUT PRESENCE OF ULCERS (sore throat, sores in mouth, or a toothache) UNUSUAL RASH, SWELLING OR PAIN  UNUSUAL VAGINAL DISCHARGE OR ITCHING   Items with * indicate a potential emergency and should be followed up as soon as possible or go to the Emergency Department if any problems should occur.  Please  show the CHEMOTHERAPY ALERT CARD or IMMUNOTHERAPY ALERT CARD at check-in to the Emergency Department and triage nurse.  Should you have questions after your visit or need to cancel or reschedule your appointment, please contact [facility] - A DEPT OF [facility]  Dept: [phone]  and follow the prompts.  Office hours are 8:00 a.m. to 4:30 p.m. Monday - Friday. Please note that voicemails left after 4:00 p.m. may not be returned until the following business day.  We are closed weekends and major holidays. You have access to a nurse at all times for urgent questions. Please call the main number to the clinic Dept: [phone] and follow the prompts.   For any non-urgent questions, you may also contact your provider using MyChart. We now offer e-Visits for anyone 36 and older to request care online for non-urgent symptoms. For details visit [email or web address].   Also download the MyChart app! Go to the app store, search MyChart, open the app, select [place], and log in with your MyChart username and password.  Durvalumab  Injection What is this medication? DURVALUMAB  (dur VAL ue mab) treats some types of cancer. It works by helping your immune system slow or stop the spread of cancer cells. It is a monoclonal antibody. This medicine may be used for other purposes; ask your health care provider or pharmacist if you have questions. COMMON BRAND NAME(S): IMFINZI  What should I tell my care team before I take this medication? They need to know if you have any of these conditions: Allogeneic stem cell transplant (uses someone  else's stem cells) Autoimmune diseases, such as Crohn disease, ulcerative colitis, lupus History of chest radiation Nervous system problems, such as Guillain-Barre syndrome, myasthenia gravis Organ transplant An unusual or allergic reaction to durvalumab , other medications, foods, dyes, or preservatives Pregnant or trying to get  pregnant Breast-feeding How should I use this medication? This medication is infused into a vein. It is given by your care team in a hospital or clinic setting. A special MedGuide will be given to you before each treatment. Be sure to read this information carefully each time. Talk to your care team about the use of this medication in children. Special care may be needed. Overdosage: If you think you have taken too much of this medicine contact a poison control center or emergency room at once. NOTE: This medicine is only for you. Do not share this medicine with others. What if I miss a dose? Keep appointments for follow-up doses. It is important not to miss your dose. Call your care team if you are unable to keep an appointment. What may interact with this medication? Interactions have not been studied. This list may not describe all possible interactions. Give your health care provider a list of all the medicines, herbs, non-prescription drugs, or dietary supplements you use. Also tell them if you smoke, drink alcohol, or use illegal drugs. Some items may interact with your medicine. What should I watch for while using this medication? Your condition will be monitored carefully while you are receiving this medication. You may need blood work while taking this medication. This medication may cause serious skin reactions. They can happen weeks to months after starting the medication. Contact your care team right away if you notice fevers or flu-like symptoms with a rash. The rash may be red or purple and then turn into blisters or peeling of the skin. You may also notice a red rash with swelling of the face, lips, or lymph nodes in your neck or under your arms. Tell your care team right away if you have any change in your eyesight. Talk to your care team if you may be pregnant. Serious birth defects can occur if you take this medication during pregnancy and for 3 months after the last dose. You will  need a negative pregnancy test before starting this medication. Contraception is recommended while taking this medication and for 3 months after the last dose. Your care team can help you find the option that works for you. Do not breastfeed while taking this medication and for 3 months after the last dose. What side effects may I notice from receiving this medication? Side effects that you should report to your care team as soon as possible: Allergic reactions--skin rash, itching, hives, swelling of the face, lips, tongue, or throat Dry cough, shortness of breath or trouble breathing Eye pain, redness, irritation, or discharge with blurry or decreased vision Heart muscle inflammation--unusual weakness or fatigue, shortness of breath, chest pain, fast or irregular heartbeat, dizziness, swelling of the ankles, feet, or hands Hormone gland problems--headache, sensitivity to light, unusual weakness or fatigue, dizziness, fast or irregular heartbeat, increased sensitivity to cold or heat, excessive sweating, constipation, hair loss, increased thirst or amount of urine, tremors or shaking, irritability Infusion reactions--chest pain, shortness of breath or trouble breathing, feeling faint or lightheaded Kidney injury (glomerulonephritis)--decrease in the amount of urine, red or dark brown urine, foamy or bubbly urine, swelling of the ankles, hands, or feet Liver injury--right upper belly pain, loss of appetite, nausea, light-colored  stool, dark yellow or brown urine, yellowing skin or eyes, unusual weakness or fatigue Pain, tingling, or numbness in the hands or feet, muscle weakness, change in vision, confusion or trouble speaking, loss of balance or coordination, trouble walking, seizures Rash, fever, and swollen lymph nodes Redness, blistering, peeling, or loosening of the skin, including inside the mouth Sudden or severe stomach pain, bloody diarrhea, fever, nausea, vomiting Side effects that usually  do not require medical attention (report these to your care team if they continue or are bothersome): Bone, joint, or muscle pain Diarrhea Fatigue Loss of appetite Nausea Skin rash This list may not describe all possible side effects. Call your doctor for medical advice about side effects. You may report side effects to FDA at 1-800-FDA-1088. Where should I keep my medication? This medication is given in a hospital or clinic. It will not be stored at home. NOTE: This sheet is a summary. It may not cover all possible information. If you have questions about this medicine, talk to your doctor, pharmacist, or health care provider.  2024 Elsevier/Gold Standard (2022-01-13 00:00:00)  Docetaxel  Injection What is this medication? DOCETAXEL  (doe se TAX el) treats some types of cancer. It works by slowing down the growth of cancer cells. This medicine may be used for other purposes; ask your health care provider or pharmacist if you have questions. COMMON BRAND NAME(S): BEIZRAY , Docefrez , Docivyx , Taxotere  What should I tell my care team before I take this medication? They need to know if you have any of these conditions: Kidney disease Liver disease Low white blood cell levels Tingling of the fingers or toes or other nerve disorder An unusual or allergic reaction to docetaxel , polysorbate 80, other medications, foods, dyes, or preservatives Pregnant or trying to get pregnant Breast-feeding How should I use this medication? This medication is injected into a vein. It is given by your care team in a hospital or clinic setting. Talk to your care team about the use of this medication in children. Special care may be needed. Overdosage: If you think you have taken too much of this medicine contact a poison control center or emergency room at once. NOTE: This medicine is only for you. Do not share this medicine with others. What if I miss a dose? Keep appointments for follow-up doses. It is  important not to miss your dose. Call your care team if you are unable to keep an appointment. What may interact with this medication? Do not take this medication with any of the following: Live virus vaccines This medication may also interact with the following: Certain antibiotics, such as clarithromycin, telithromycin Certain antivirals for HIV or hepatitis Certain medications for fungal infections, such as itraconazole, ketoconazole, voriconazole Grapefruit juice Nefazodone Supplements, such as St. John's wort This list may not describe all possible interactions. Give your health care provider a list of all the medicines, herbs, non-prescription drugs, or dietary supplements you use. Also tell them if you smoke, drink alcohol, or use illegal drugs. Some items may interact with your medicine. What should I watch for while using this medication? This medication may make you feel generally unwell. This is not uncommon as chemotherapy can affect healthy cells as well as cancer cells. Report any side effects. Continue your course of treatment even though you feel ill unless your care team tells you to stop. You may need blood work done while you are taking this medication. This medication can cause serious side effects and infusion reactions. To reduce the risk,  your care team may give you other medications to take before receiving this one. Be sure to follow the directions from your care team. This medication may increase your risk of getting an infection. Call your care team for advice if you get a fever, chills, sore throat, or other symptoms of a cold or flu. Do not treat yourself. Try to avoid being around people who are sick. Avoid taking medications that contain aspirin, acetaminophen , ibuprofen, naproxen, or ketoprofen unless instructed by your care team. These medications may hide a fever. Be careful brushing or flossing your teeth or using a toothpick because you may get an infection or  bleed more easily. If you have any dental work done, tell your dentist you are receiving this medication. Some products may contain alcohol. Ask your care team if this medication contains alcohol. Be sure to tell all care teams you are taking this medicine. Certain medications, like metronidazole and disulfiram, can cause an unpleasant reaction when taken with alcohol. The reaction includes flushing, headache, nausea, vomiting, sweating, and increased thirst. The reaction can last from 30 minutes to several hours. This medication may affect your coordination, reaction time, or judgement. Do not drive or operate machinery until you know how this medication affects you. Sit up or stand slowly to reduce the risk of dizzy or fainting spells. Drinking alcohol with this medication can increase the risk of these side effects. Talk to your care team about your risk of cancer. You may be more at risk for certain types of cancer if you take this medication. Talk to your care team if you wish to become pregnant or think you might be pregnant. This medication can cause serious birth defects if taken during pregnancy or if you get pregnant within 2 months after stopping therapy. A negative pregnancy test is required before starting this medication. A reliable form of contraception is recommended while taking this medication and for 2 months after stopping it. Talk to your care team about reliable forms of contraception. Do not breast-feed while taking this medication and for 1 week after stopping therapy. Use a condom during sex and for 4 months after stopping therapy. Tell your care team right away if you think your partner might be pregnant. This medication can cause serious birth defects. This medication may cause infertility. Talk to your care team if you are concerned about your fertility. What side effects may I notice from receiving this medication? Side effects that you should report to your care team as soon as  possible: Allergic reactions--skin rash, itching, hives, swelling of the face, lips, tongue, or throat Change in vision such as blurry vision, seeing halos around lights, vision loss Infection--fever, chills, cough, or sore throat Infusion reactions--chest pain, shortness of breath or trouble breathing, feeling faint or lightheaded Low red blood cell level--unusual weakness or fatigue, dizziness, headache, trouble breathing Pain, tingling, or numbness in the hands or feet Painful swelling, warmth, or redness of the skin, blisters or sores at the infusion site Redness, blistering, peeling, or loosening of the skin, including inside the mouth Sudden or severe stomach pain, bloody diarrhea, fever, nausea, vomiting Swelling of the ankles, hands, or feet Tumor lysis syndrome (TLS)--nausea, vomiting, diarrhea, decrease in the amount of urine, dark urine, unusual weakness or fatigue, confusion, muscle pain or cramps, fast or irregular heartbeat, joint pain Unusual bruising or bleeding Side effects that usually do not require medical attention (report to your care team if they continue or are bothersome): Change in nail  shape, thickness, or color Change in taste Hair loss Increased tears This list may not describe all possible side effects. Call your doctor for medical advice about side effects. You may report side effects to FDA at 1-800-FDA-1088. Where should I keep my medication? This medication is given in a hospital or clinic. It will not be stored at home. NOTE: This sheet is a summary. It may not cover all possible information. If you have questions about this medicine, talk to your doctor, pharmacist, or health care provider.  2024 Elsevier/Gold Standard (2021-11-06 00:00:00)  Oxaliplatin  Injection What is this medication? OXALIPLATIN  (ox AL i PLA tin) treats colorectal cancer. It works by slowing down the growth of cancer cells. This medicine may be used for other purposes; ask your  health care provider or pharmacist if you have questions. COMMON BRAND NAME(S): Eloxatin  What should I tell my care team before I take this medication? They need to know if you have any of these conditions: Heart disease History of irregular heartbeat or rhythm Liver disease Low blood cell levels (white cells, red cells, and platelets) Lung or breathing disease, such as asthma Take medications that treat or prevent blood clots Tingling of the fingers, toes, or other nerve disorder An unusual or allergic reaction to oxaliplatin , other medications, foods, dyes, or preservatives If you or your partner are pregnant or trying to get pregnant Breast-feeding How should I use this medication? This medication is injected into a vein. It is given by your care team in a hospital or clinic setting. Talk to your care team about the use of this medication in children. Special care may be needed. Overdosage: If you think you have taken too much of this medicine contact a poison control center or emergency room at once. NOTE: This medicine is only for you. Do not share this medicine with others. What if I miss a dose? Keep appointments for follow-up doses. It is important not to miss a dose. Call your care team if you are unable to keep an appointment. What may interact with this medication? Do not take this medication with any of the following: Cisapride Dronedarone Pimozide Thioridazine This medication may also interact with the following: Aspirin and aspirin-like medications Certain medications that treat or prevent blood clots, such as warfarin, apixaban, dabigatran, and rivaroxaban Cisplatin Cyclosporine Diuretics Medications for infection, such as acyclovir, adefovir, amphotericin B, bacitracin, cidofovir, foscarnet, ganciclovir, gentamicin, pentamidine, vancomycin NSAIDs, medications for pain and inflammation, such as ibuprofen or naproxen Other medications that cause heart rhythm  changes Pamidronate Zoledronic acid This list may not describe all possible interactions. Give your health care provider a list of all the medicines, herbs, non-prescription drugs, or dietary supplements you use. Also tell them if you smoke, drink alcohol, or use illegal drugs. Some items may interact with your medicine. What should I watch for while using this medication? Your condition will be monitored carefully while you are receiving this medication. You may need blood work while taking this medication. This medication may make you feel generally unwell. This is not uncommon as chemotherapy can affect healthy cells as well as cancer cells. Report any side effects. Continue your course of treatment even though you feel ill unless your care team tells you to stop. This medication may increase your risk of getting an infection. Call your care team for advice if you get a fever, chills, sore throat, or other symptoms of a cold or flu. Do not treat yourself. Try to avoid being around people  who are sick. Avoid taking medications that contain aspirin, acetaminophen , ibuprofen, naproxen, or ketoprofen unless instructed by your care team. These medications may hide a fever. Be careful brushing or flossing your teeth or using a toothpick because you may get an infection or bleed more easily. If you have any dental work done, tell your dentist you are receiving this medication. This medication can make you more sensitive to cold. Do not drink cold drinks or use ice. Cover exposed skin before coming in contact with cold temperatures or cold objects. When out in cold weather wear warm clothing and cover your mouth and nose to warm the air that goes into your lungs. Tell your care team if you get sensitive to the cold. Talk to your care team if you or your partner are pregnant or think either of you might be pregnant. This medication can cause serious birth defects if taken during pregnancy and for 9 months  after the last dose. A negative pregnancy test is required before starting this medication. A reliable form of contraception is recommended while taking this medication and for 9 months after the last dose. Talk to your care team about effective forms of contraception. Do not father a child while taking this medication and for 6 months after the last dose. Use a condom while having sex during this time period. Do not breastfeed while taking this medication and for 3 months after the last dose. This medication may cause infertility. Talk to your care team if you are concerned about your fertility. What side effects may I notice from receiving this medication? Side effects that you should report to your care team as soon as possible: Allergic reactions--skin rash, itching, hives, swelling of the face, lips, tongue, or throat Bleeding--bloody or black, tar-like stools, vomiting blood or brown material that looks like coffee grounds, red or dark brown urine, small red or purple spots on skin, unusual bruising or bleeding Dry cough, shortness of breath or trouble breathing Heart rhythm changes--fast or irregular heartbeat, dizziness, feeling faint or lightheaded, chest pain, trouble breathing Infection--fever, chills, cough, sore throat, wounds that don't heal, pain or trouble when passing urine, general feeling of discomfort or being unwell Liver injury--right upper belly pain, loss of appetite, nausea, light-colored stool, dark yellow or brown urine, yellowing skin or eyes, unusual weakness or fatigue Low red blood cell level--unusual weakness or fatigue, dizziness, headache, trouble breathing Muscle injury--unusual weakness or fatigue, muscle pain, dark yellow or brown urine, decrease in amount of urine Pain, tingling, or numbness in the hands or feet Sudden and severe headache, confusion, change in vision, seizures, which may be signs of posterior reversible encephalopathy syndrome (PRES) Unusual  bruising or bleeding Side effects that usually do not require medical attention (report to your care team if they continue or are bothersome): Diarrhea Nausea Pain, redness, or swelling with sores inside the mouth or throat Unusual weakness or fatigue Vomiting This list may not describe all possible side effects. Call your doctor for medical advice about side effects. You may report side effects to FDA at 1-800-FDA-1088. Where should I keep my medication? This medication is given in a hospital or clinic. It will not be stored at home. NOTE: This sheet is a summary. It may not cover all possible information. If you have questions about this medicine, talk to your doctor, pharmacist, or health care provider.  2024 Elsevier/Gold Standard (2023-08-13 00:00:00)  Leucovorin  Injection What is this medication? LEUCOVORIN  (loo koe VOR in) prevents side effects  from certain medications, such as methotrexate. It works by increasing folate levels. This helps protect healthy cells in your body. It may also be used to treat anemia caused by low levels of folate. It can also be used with fluorouracil , a type of chemotherapy, to treat colorectal cancer. It works by increasing the effects of fluorouracil  in the body. This medicine may be used for other purposes; ask your health care provider or pharmacist if you have questions. What should I tell my care team before I take this medication? They need to know if you have any of these conditions: Anemia from low levels of vitamin B12 in the blood An unusual or allergic reaction to leucovorin , folic acid, other medications, foods, dyes, or preservatives Pregnant or trying to get pregnant Breastfeeding How should I use this medication? This medication is injected into a vein or a muscle. It is given by your care team in a hospital or clinic setting. Talk to your care team about the use of this medication in children. Special care may be needed. Overdosage: If  you think you have taken too much of this medicine contact a poison control center or emergency room at once. NOTE: This medicine is only for you. Do not share this medicine with others. What if I miss a dose? Keep appointments for follow-up doses. It is important not to miss your dose. Call your care team if you are unable to keep an appointment. What may interact with this medication? Capecitabine Fluorouracil  Phenobarbital Phenytoin Primidone Trimethoprim ;sulfamethoxazole  This list may not describe all possible interactions. Give your health care provider a list of all the medicines, herbs, non-prescription drugs, or dietary supplements you use. Also tell them if you smoke, drink alcohol, or use illegal drugs. Some items may interact with your medicine. What should I watch for while using this medication? Your condition will be monitored carefully while you are receiving this medication. This medication may increase the side effects of 5-fluorouracil . Tell your care team if you have diarrhea or mouth sores that do not get better or that get worse. What side effects may I notice from receiving this medication? Side effects that you should report to your care team as soon as possible: Allergic reactions--skin rash, itching, hives, swelling of the face, lips, tongue, or throat This list may not describe all possible side effects. Call your doctor for medical advice about side effects. You may report side effects to FDA at 1-800-FDA-1088. Where should I keep my medication? This medication is given in a hospital or clinic. It will not be stored at home. NOTE: This sheet is a summary. It may not cover all possible information. If you have questions about this medicine, talk to your doctor, pharmacist, or health care provider.  2024 Elsevier/Gold Standard (2022-02-03 00:00:00)  Fluorouracil  Injection What is this medication? FLUOROURACIL  (flure oh YOOR a sil) treats some types of cancer. It  works by slowing down the growth of cancer cells. This medicine may be used for other purposes; ask your health care provider or pharmacist if you have questions. COMMON BRAND NAME(S): Adrucil  What should I tell my care team before I take this medication? They need to know if you have any of these conditions: Blood disorders Dihydropyrimidine dehydrogenase (DPD) deficiency Infection, such as chickenpox, cold sores, herpes Kidney disease Liver disease Poor nutrition Recent or ongoing radiation therapy An unusual or allergic reaction to fluorouracil , other medications, foods, dyes, or preservatives If you or your partner are pregnant or trying to  get pregnant Breast-feeding How should I use this medication? This medication is injected into a vein. It is administered by your care team in a hospital or clinic setting. Talk to your care team about the use of this medication in children. Special care may be needed. Overdosage: If you think you have taken too much of this medicine contact a poison control center or emergency room at once. NOTE: This medicine is only for you. Do not share this medicine with others. What if I miss a dose? Keep appointments for follow-up doses. It is important not to miss your dose. Call your care team if you are unable to keep an appointment. What may interact with this medication? Do not take this medication with any of the following: Live virus vaccines This medication may also interact with the following: Medications that treat or prevent blood clots, such as warfarin, enoxaparin, dalteparin This list may not describe all possible interactions. Give your health care provider a list of all the medicines, herbs, non-prescription drugs, or dietary supplements you use. Also tell them if you smoke, drink alcohol, or use illegal drugs. Some items may interact with your medicine. What should I watch for while using this medication? Your condition will be monitored  carefully while you are receiving this medication. This medication may make you feel generally unwell. This is not uncommon as chemotherapy can affect healthy cells as well as cancer cells. Report any side effects. Continue your course of treatment even though you feel ill unless your care team tells you to stop. In some cases, you may be given additional medications to help with side effects. Follow all directions for their use. This medication may increase your risk of getting an infection. Call your care team for advice if you get a fever, chills, sore throat, or other symptoms of a cold or flu. Do not treat yourself. Try to avoid being around people who are sick. This medication may increase your risk to bruise or bleed. Call your care team if you notice any unusual bleeding. Be careful brushing or flossing your teeth or using a toothpick because you may get an infection or bleed more easily. If you have any dental work done, tell your dentist you are receiving this medication. Avoid taking medications that contain aspirin, acetaminophen , ibuprofen, naproxen, or ketoprofen unless instructed by your care team. These medications may hide a fever. Do not treat diarrhea with over the counter products. Contact your care team if you have diarrhea that lasts more than 2 days or if it is severe and watery. This medication can make you more sensitive to the sun. Keep out of the sun. If you cannot avoid being in the sun, wear protective clothing and sunscreen. Do not use sun lamps, tanning beds, or tanning booths. Talk to your care team if you or your partner wish to become pregnant or think you might be pregnant. This medication can cause serious birth defects if taken during pregnancy and for 3 months after the last dose. A reliable form of contraception is recommended while taking this medication and for 3 months after the last dose. Talk to your care team about effective forms of contraception. Do not father  a child while taking this medication and for 3 months after the last dose. Use a condom while having sex during this time period. Do not breastfeed while taking this medication. This medication may cause infertility. Talk to your care team if you are concerned about your fertility. What side  effects may I notice from receiving this medication? Side effects that you should report to your care team as soon as possible: Allergic reactions--skin rash, itching, hives, swelling of the face, lips, tongue, or throat Heart attack--pain or tightness in the chest, shoulders, arms, or jaw, nausea, shortness of breath, cold or clammy skin, feeling faint or lightheaded Heart failure--shortness of breath, swelling of the ankles, feet, or hands, sudden weight gain, unusual weakness or fatigue Heart rhythm changes--fast or irregular heartbeat, dizziness, feeling faint or lightheaded, chest pain, trouble breathing High ammonia level--unusual weakness or fatigue, confusion, loss of appetite, nausea, vomiting, seizures Infection--fever, chills, cough, sore throat, wounds that don't heal, pain or trouble when passing urine, general feeling of discomfort or being unwell Low red blood cell level--unusual weakness or fatigue, dizziness, headache, trouble breathing Pain, tingling, or numbness in the hands or feet, muscle weakness, change in vision, confusion or trouble speaking, loss of balance or coordination, trouble walking, seizures Redness, swelling, and blistering of the skin over hands and feet Severe or prolonged diarrhea Unusual bruising or bleeding Side effects that usually do not require medical attention (report to your care team if they continue or are bothersome): Dry skin Headache Increased tears Nausea Pain, redness, or swelling with sores inside the mouth or throat Sensitivity to light Vomiting This list may not describe all possible side effects. Call your doctor for medical advice about side  effects. You may report side effects to FDA at 1-800-FDA-1088. Where should I keep my medication? This medication is given in a hospital or clinic. It will not be stored at home. NOTE: This sheet is a summary. It may not cover all possible information. If you have questions about this medicine, talk to your doctor, pharmacist, or health care provider.  2024 Elsevier/Gold Standard (2022-01-06 00:00:00)  Dehydration, Adult Dehydration is a condition in which there is not enough water  or other fluids in the body. This happens when a person loses more fluids than they take in. Important organs, such as the kidneys, brain, and heart, cannot function without a proper amount of fluids. Any loss of fluids from the body can lead to dehydration. Dehydration can be mild, moderate, or severe. It should be treated right away to prevent it from becoming severe. What are the causes? Dehydration may be caused by: Health conditions, such as diarrhea, vomiting, fever, infection, or sweating or urinating a lot. Not drinking enough fluids. Certain medicines, such as medicines that remove excess fluid from the body (diuretics). Lack of safe drinking water . Not being able to get enough water  and food. What increases the risk? The following factors may make you more likely to develop this condition: Having a long-term (chronic) illness that has not been treated properly, such as diabetes, heart disease, or kidney disease. Being 72 years of age or older. Having a disability. Living in a place that is high in altitude, where thinner, drier air causes more fluid loss. Doing exercises that put stress on your body for a long time (endurance sports). Being active in a hot climate. What are the signs or symptoms? Symptoms of dehydration depend on how severe it is. Mild or moderate dehydration Thirst. Dry lips or dry mouth. Dizziness or light-headedness. Muscle cramps. Dark urine. Urine may be the color of  tea. Less urine or tears produced than usual. Headache. Severe dehydration Changes in skin. Your skin may be cold and clammy, blotchy, or pale. Your skin also may not return to normal after being lightly pinched and  released. Little or no tears, urine, or sweat. Rapid breathing and low blood pressure. Your pulse may be weak or may be faster than 100 beats per minute when you are sitting still. Other changes, such as: Feeling very thirsty. Sunken eyes. Cold hands and feet. Confusion. Being very tired (lethargic) or having trouble waking from sleep. Short-term weight loss. Loss of consciousness. How is this diagnosed? This condition is diagnosed based on your symptoms and a physical exam. You may have blood and urine tests to help confirm the diagnosis. How is this treated? Treatment for this condition depends on how severe it is. Treatment should be started right away. Do not wait until dehydration becomes severe. Severe dehydration is an emergency and needs to be treated in a hospital. Mild or moderate dehydration can be treated at home. You may be asked to: Drink more fluids. Drink an oral rehydration solution (ORS). This drink restores fluids, salts, and minerals in the blood (electrolytes). Stop any activities that caused dehydration, such as exercise. Cool off with cool compresses, cool mist, or cool fluids, if heat or too much sweat caused your condition. Take medicine to treat fever, if fever caused your condition. Take medicine to treat nausea and diarrhea, if vomiting or diarrhea caused your condition. Severe dehydration can be treated: With IV fluids. By correcting abnormal levels of electrolytes in your body. By treating the underlying cause of dehydration. Follow these instructions at home: Oral rehydration solution If told by your health care provider, drink an ORS: Make an ORS by following instructions on the package. Start by drinking small amounts, about  cup (120  mL) every 5-10 minutes. Slowly increase how much you drink until you have taken the amount recommended by your health care provider.  Eating and drinking  Drink enough clear fluid to keep your urine pale yellow. If you were told to drink an ORS, finish the ORS first and then start slowly drinking other clear fluids. Drink fluids such as: Water . Do not drink only water . Doing that can lead to hyponatremia, which is having too little salt (sodium) in the body. Water  from ice chips you suck on. Diluted fruit juice. This is fruit juice that you have added water  to. Low-calorie sports drinks. Eat foods that contain a healthy balance of electrolytes, such as bananas, oranges, potatoes, tomatoes, and spinach. Do not drink alcohol. Avoid the following: Drinks that contain a lot of sugar. These include high-calorie sports drinks, fruit juice that is not diluted, and soda. Caffeine. Foods that are greasy or contain a lot of fat or sugar. General instructions Take over-the-counter and prescription medicines only as told by your health care provider. Do not take sodium tablets. Doing that can lead to having too much sodium in the body (hypernatremia). Return to your normal activities as told by your health care provider. Ask your health care provider what activities are safe for you. Keep all follow-up visits. Your health care provider may need to check your progress and suggest new ways to treat your condition. Contact a health care provider if: You have muscle cramps, pain, or discomfort, such as: Pain in your abdomen and the pain gets worse or stays in one area. Stiff neck. You have a rash. You are more irritable than usual. You are sleepier or have a harder time waking. You feel weak or dizzy. You feel very thirsty. Get help right away if: You have symptoms of severe dehydration. You vomit every time you eat or drink. Your vomiting gets  worse, does not go away, or includes blood or green  matter (bile). You are getting treatment but symptoms are getting worse. You have a fever. You have a severe headache. You have: Diarrhea that gets worse or does not go away. Blood in your stool. This may cause stool to look black and tarry. Not urinating, or urinating only a small amount of very dark urine, within 6-8 hours. You have trouble breathing. These symptoms may be an emergency. Get help right away. Do not wait to see if the symptoms will go away. Do not drive yourself to the hospital. Call 911. This information is not intended to replace advice given to you by your health care provider. Make sure you discuss any questions you have with your health care provider. Document Revised: 03/30/2022 Document Reviewed: 03/30/2022 Elsevier Patient Education  2024 [employer].

## 2024-10-10 NOTE — Progress Notes (Signed)
 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 Lanny.  Patient to receive FLOT every 14 days plus Durvalumab  every 28 days.  Today is cycle 1.  Plan is for surgery after neoadjuvant chemotherapy completed.  Past medical history includes reflux, hyperlipidemia, prostate cancer, kidney stones, and dysphagia beginning October 2025  Medications include multivitamin, Prilosec, Zofran , Compazine , and Carafate .  Labs include glucose 122  Height: 6 feet 0 inches. Weight: 166.25 pounds January 27 177 pounds January 15 181 pounds November 25 183 pounds June 27  *9% weight loss in less than 3 months, clinically significant  Patient has a partial obstruction in the lower third of his esophagus extending to the GE junction.  Noted gastroenterologist discussed possibility of a feeding tube.  S/p bone graft after cyst removed from root canal.  Patient has been eating pureed diet up until recently.  He now only tolerates pudding and ice cream.  He has been drinking Ensure Plus.  Reports water  is difficult to swallow.  He worries about dehydration.  Estimated nutrition needs:  2260-2640 cal, 113-132 g protein, greater than 2.5 L fluid.  Nutrition diagnosis:  Unintended weight loss related to esophageal cancer and dysphagia as evidenced by 9% loss over 2 weeks.  Intervention: Education provided on consuming frequent small meals/snacks using foods and beverages as tolerated. Discussed thickening agent to help thicken liquids for easier swallowing.  Provided a sample of Thick-It. Provided multiple samples of various high-calorie, high-protein oral nutrition supplements as well as protein supplements.  Discussed using these to make smoothies or shakes as tolerated. Reviewed experimenting with different temperatures of foods for increased tolerance.  Patient understands he needs to avoid cold foods and beverages after oxaliplatin  infusion. Gave nutrition facts sheets.  Provided  contact information.  Monitoring, evaluation, goals: Patient will tolerate adequate calories and protein to minimize weight loss.  Next visit: Monday, February 9 during infusion with Joli.  **Disclaimer: This note was dictated with voice recognition software. Similar sounding words can inadvertently be transcribed and this note may contain transcription errors which may not have been corrected upon publication of note.**

## 2024-10-10 NOTE — Progress Notes (Signed)
 "     [facility]   [phone] [phone]    Patient Care Team: Amon Aloysius BRAVO, MD as PCP - General (Internal Medicine) Shana Steffan ORN, MD as Referring Physician (Gastroenterology) [facility] (Ophthalmology) Ardis, Evalene CROME, RN as Oncology Nurse Navigator Lanny Callander, MD as Consulting Physician (Hematology and Oncology)   CHIEF COMPLAINT: Follow-up GE junction carcinoma  Oncology History  Prostate cancer [facility])  04/27/2024 Initial Diagnosis   Prostate cancer (HCC)   08/13/2024 Genetic Testing   Negative genetics testing. Report date is 08/13/2024.  The CancerNext-Expanded gene panel offered by [facility] and includes sequencing, rearrangement, and RNA analysis for the following 77 genes: AIP, ALK, APC, ATM, BAP1, BARD1, BMPR1A, BRCA1, BRCA2, BRIP1, CDC73, CDH1, CDK4, CDKN1B, CDKN2A, CEBPA, CHEK2, CTNNA1, DDX41, DICER1, ETV6, FH, FLCN, GATA2, LZTR1, MAX, MBD4, MEN1, MET, MLH1, MSH2, MSH3, MSH6, MUTYH, NF1, NF2, NTHL1, PALB2, PHOX2B, PMS2, POT1, PRKAR1A, PTCH1, PTEN, RAD51C, RAD51D, RB1, RET, RPS20, RUNX1, SDHA, SDHAF2, SDHB, SDHC, SDHD, SMAD4, SMARCA4, SMARCB1, SMARCE1, STK11, SUFU, TMEM127, TP53, TSC1, TSC2, VHL, and WT1 (sequencing and deletion/duplication); AXIN2, CTNNA1, DDX41, EGFR, HOXB13, KIT, MBD4, MITF, MSH3, PDGFRA, POLD1 and POLE (sequencing only); EPCAM and GREM1 (deletion/duplication only). RNA data is routinely analyzed for use in variant interpretation for all genes.   Esophageal cancer (HCC)  09/28/2024 Initial Diagnosis   Esophageal cancer (HCC)   09/28/2024 Cancer Staging   Staging form: Esophagus - Adenocarcinoma, AJCC 8th Edition - Clinical stage from 09/28/2024: Stage III (cT3, cN1, cM0, GX) - Signed by Lanny Callander, MD on 09/28/2024 Histologic grading system: 3 grade system   10/10/2024 -  Chemotherapy   Patient is on Treatment Plan : GASTROESOPHAGEAL Durvalumab  q28d + FLOT q14d / Durvalumab  q28d        CURRENT THERAPY:  Pending neoadjuvant FLOT/Durvalumab  followed by surgery (Dr. Shyrl)  INTERVAL HISTORY [name] presents as scheduled, seen in the infusion room accompanied by his daughter for first treatment.  Placement went well, he attended chemo class and met with Dr. Shyrl who finds him to be a good surgical candidate after chemo.  He had oral surgery on Friday with bone grafting and is recovering well, continues antibiotics.  He remains active.  Unfortunately he is not able to tolerate much softer solids anymore, diet consists of primarily liquids. Can eat ice chips, pudding, and thickened liquids but water  is the worst. Whenever he tries to eat normal food he develops dysphagia and odynophagia after 2 or 3 bites where his esophagus tightens up and he has to pause until it relaxes and he can try again.  Bowels are sluggish but moving with Milk of Magnesia. Denies pain, fever/chills, cough, chest pain, dyspnea, baseline neuropathy, or other specific complaints.   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    Pneumonia    Prostate cancer [facility])      Past Surgical History:  Procedure Laterality Date   IR IMAGING GUIDED PORT INSERTION  10/04/2024   JOINT REPLACEMENT Left 08/2020   Knee replacement   REPLACEMENT TOTAL KNEE Left    ~08-2020   ROBOT ASSISTED LAPAROSCOPIC RADICAL PROSTATECTOMY N/A 04/27/2024   Procedure: PROSTATECTOMY, RADICAL, ROBOT-ASSISTED, LAPAROSCOPIC;  Surgeon:  Renda Glance, MD;  Location: [facility];  Service: Urology;  Laterality: N/A;  LEVEL 2   TONSILLECTOMY     UMBILICAL HERNIA REPAIR N/A 04/27/2024   Procedure: REPAIR, HERNIA, UMBILICAL, ADULT;  Surgeon: Renda Glance, MD;  Location: [facility];  Service: Urology;  Laterality: N/A;     Outpatient Encounter  Medications as of 10/10/2024  Medication Sig   Acetaminophen  Extra Strength 500 MG/15ML LIQD Take 30 mLs by mouth 3 (three) times daily as needed.   azithromycin (ZITHROMAX) 1 g powder Take 1 g by mouth once.   budesonide -formoterol  (BREYNA ) 80-4.5 MCG/ACT inhaler Inhale 2 puffs into the lungs 2 (two) times daily as needed (respiratory issues.). (Patient not taking: Reported on 10/06/2024)   lidocaine -prilocaine  (EMLA ) cream Apply to affected area once   Multiple Vitamin (MULTIVITAMIN WITH MINERALS) TABS tablet Take 1 tablet by mouth daily.   Omega-3 Fatty Acids (SUSTAINABLE VEGAN OMEGA-3 PO) Take by mouth. (Patient not taking: Reported on 10/06/2024)   omeprazole  (PRILOSEC) 10 MG capsule Take 10 mg by mouth daily.   ondansetron  (ZOFRAN ) 8 MG tablet Take 1 tablet (8 mg total) by mouth every 8 (eight) hours as needed for nausea or vomiting. Start on the third day after chemotherapy.   pantoprazole  sodium (PROTONIX ) 40 mg Place 40 mg into feeding tube daily. (Patient not taking: Reported on 10/06/2024)   Policosanol 30 MG TABS Policosanol (Patient not taking: Reported on 10/06/2024)   POLICOSANOL PO Take by mouth. (Patient not taking: Reported on 10/06/2024)   prochlorperazine  (COMPAZINE ) 10 MG tablet Take 1 tablet (10 mg total) by mouth every 6 (six) hours as needed for nausea or vomiting.   sucralfate  (CARAFATE ) 1 GM/10ML suspension Take 10 mLs (1 g total) by mouth 4 (four) times daily -  with meals and at bedtime.   tadalafil (CIALIS) 5 MG tablet Take 5 mg by mouth daily as needed for erectile dysfunction.   triamcinolone  cream (KENALOG ) 0.1 % Apply 1 Application topically 2 (two) times daily as needed. (Patient not taking: Reported on 10/06/2024)   No facility-administered encounter medications on file as of 10/10/2024.     There were no vitals filed for this visit. There is no height or weight on file to calculate BMI.   ECOG PERFORMANCE STATUS: 0 - Asymptomatic  PHYSICAL EXAM GENERAL:alert, no  distress and comfortable SKIN: no rash  EYES: sclera clear LUNGS: clear with normal breathing effort HEART: regular rate & rhythm, no lower extremity edema ABDOMEN: abdomen soft, non-tender and normal bowel sounds NEURO: alert & oriented x 3 with fluent speech, no focal motor/sensory deficits PAC without erythema    CBC    Latest Ref Rng & Units 10/10/2024    9:56 AM 09/22/2024    5:10 PM 04/28/2024    4:36 AM  CBC  WBC 4.0 - 10.5 K/uL 6.1  6.0    Hemoglobin 13.0 - 17.0 g/dL 86.5  86.4  86.5   Hematocrit 39.0 - 52.0 % 40.0  40.5  40.2   Platelets 150 - 400 K/uL 237  259.0        CMP     Latest Ref Rng & Units 10/10/2024    9:56 AM 09/22/2024    5:10 PM 04/21/2024    8:27 AM  CMP  Glucose 70 - 99 mg/dL 877  82  92   BUN 8 - 23 mg/dL 15  14  16    Creatinine 0.61 - 1.24 mg/dL 8.99  8.96  8.94   Sodium 135 - 145  mmol/L 138  137  138   Potassium 3.5 - 5.1 mmol/L 3.7  3.9  3.7   Chloride 98 - 111 mmol/L 101  103  103   CO2 22 - 32 mmol/L 26  27  25    Calcium  8.9 - 10.3 mg/dL 9.3  8.7  9.1   Total Protein 6.5 - 8.1 g/dL 7.3  6.9    Total Bilirubin 0.0 - 1.2 mg/dL 0.6  1.0    Alkaline Phos 38 - 126 U/L 96  83    AST 15 - 41 U/L 22  21    ALT 0 - 44 U/L 16  20        ASSESSMENT & PLAN: 69 year old male  Esophageal cancer, cTxN1M0, locally advanced regional nodes - Diagnosed in January 2026.  Patient presents with progressive dysphagia for 3 weeks -Endoscopy on September 22, 2024 showed a distal esophageal mass, biopsy confirmed intramucosal adenocarcinoma. -CT and PET scan showed hypermetabolic esophageal tumor and periesophageal and gastroesophagea ligament adenopathy, no distant metastasis. -Case reviewed in tumor board, the consensus is neoadjuvant treatment -Met with Dr. Shyrl who finds him to be an ideal surgical candidate after chemo. EUS scheduled 10/16/24 for T staging - Dr. Lanny recommended Neoadjuvant chemotherapy FLOT and durvalumab , port placed.  - Will monitor for  clinical signs of improvement on neoadjuvant chemo  Dysphagia, weight loss, GERD -Secondary to #1, on PPI and carafate   -Unfortunately now tolerating primarily liquids with further weight loss   Dental surgery -Recovering well from recent surgery/bone graft. Site looks good. Continue abx -Dr. Elpidio   Prostate cancer -S/p prostatectomy, PSA undetectable  -Genetics negative -Dr. Renda and Dr. Cam    PLAN: -Mr. Parilla appears stable, labs reviewed, PS adequate for treatment -Reviewed the curative goal of neoadjuvant chemo, potential side effects and symptom management for chemoimmunotherapy and G-CSF.  He agrees to proceed -1 L NS with treatment today for mild dehydration -Urgent dietitian referral placed for progressive dysphagia and weight loss -Phone follow-up next week for toxicity check, then return in 2 weeks for cycle 2   All questions were answered. The patient knows to call the clinic with any problems, questions or concerns. No barriers to learning were detected. I spent 20 minutes counseling the patient face to face. The total time spent in the appointment was 30 minutes and more than 50% was on counseling, review of test results, and coordination of care.   Janaki Exley K Nayleen Janosik, NP 10/10/2024   "

## 2024-10-11 ENCOUNTER — Other Ambulatory Visit: Payer: Self-pay

## 2024-10-11 ENCOUNTER — Encounter: Payer: Self-pay | Admitting: Hematology

## 2024-10-11 ENCOUNTER — Inpatient Hospital Stay

## 2024-10-11 ENCOUNTER — Telehealth: Payer: Self-pay

## 2024-10-11 DIAGNOSIS — C155 Malignant neoplasm of lower third of esophagus: Secondary | ICD-10-CM

## 2024-10-11 DIAGNOSIS — R110 Nausea: Secondary | ICD-10-CM | POA: Insufficient documentation

## 2024-10-11 DIAGNOSIS — E860 Dehydration: Secondary | ICD-10-CM | POA: Insufficient documentation

## 2024-10-11 MED ORDER — PEGFILGRASTIM-JMDB 6 MG/0.6ML ~~LOC~~ SOSY
6.0000 mg | PREFILLED_SYRINGE | Freq: Once | SUBCUTANEOUS | Status: AC
  Administered 2024-10-11: 6 mg via SUBCUTANEOUS

## 2024-10-11 NOTE — Telephone Encounter (Signed)
 LM for patient that this nurse was calling to see how they were doing after their treatment. Please call back to Dr. Latanya Maudlin nurse at [phone] if they have any questions or concerns regarding the treatment.

## 2024-10-11 NOTE — Progress Notes (Signed)
 Verbal order w/readback from Dr Lanny for 1L NS IVF over 1hr, IV Compazine  10mg  PRN (Give IV Compazine  1st for nausea if pt continues to have nausea then give IV Phenergan ) and IV Phenergan  25mg  PRN.  Orders placed under Supportive Therapy plan per Dr Demetra request.

## 2024-10-12 ENCOUNTER — Telehealth: Payer: Self-pay

## 2024-10-12 ENCOUNTER — Inpatient Hospital Stay

## 2024-10-12 ENCOUNTER — Inpatient Hospital Stay: Admitting: Hematology

## 2024-10-12 VITALS — BP 135/82 | HR 92

## 2024-10-12 DIAGNOSIS — R110 Nausea: Secondary | ICD-10-CM

## 2024-10-12 DIAGNOSIS — C155 Malignant neoplasm of lower third of esophagus: Secondary | ICD-10-CM | POA: Diagnosis not present

## 2024-10-12 DIAGNOSIS — E860 Dehydration: Secondary | ICD-10-CM

## 2024-10-12 MED ORDER — SODIUM CHLORIDE 0.9 % IV SOLN: Freq: Once | INTRAVENOUS | Status: AC

## 2024-10-12 MED ORDER — PROMETHAZINE HCL 25 MG RE SUPP: 25.0000 mg | Freq: Four times a day (QID) | RECTAL | 1 refills | Status: AC | PRN

## 2024-10-12 MED ORDER — SODIUM CHLORIDE 0.9 % IV SOLN
25.0000 mg | Freq: Once | INTRAVENOUS | Status: AC
  Administered 2024-10-12: 25 mg via INTRAVENOUS
  Filled 2024-10-12: qty 1

## 2024-10-12 MED ORDER — ONDANSETRON 4 MG PO TBDP: 4.0000 mg | ORAL_TABLET | Freq: Three times a day (TID) | ORAL | 2 refills | Status: AC | PRN

## 2024-10-12 MED ORDER — SODIUM CHLORIDE 0.9 % IV SOLN: 25.0000 mg | Freq: Once | INTRAVENOUS | Status: DC

## 2024-10-12 MED ORDER — PROCHLORPERAZINE EDISYLATE 10 MG/2ML IJ SOLN
10.0000 mg | Freq: Once | INTRAMUSCULAR | Status: AC
  Administered 2024-10-12: 10 mg via INTRAVENOUS
  Filled 2024-10-12: qty 2

## 2024-10-12 NOTE — Telephone Encounter (Signed)
 Oral Oncology Patient Advocate Encounter   Received notification that prior authorization for promethazine  (PHENERGAN ) 25 MG suppository  is required.   PA submitted on 10/12/2024 Key [id number] Status is pending      Charlott Hamilton,  CPhT-Adv  she/her/hers [facility]  [facility] Pharmacy Technician Patient Advocate Specialist III [facility] Phone: [phone]  Fax: [phone] [email or web address]

## 2024-10-12 NOTE — Assessment & Plan Note (Signed)
 cTxN1M0,  - Diagnosed in January 2026.  Patient presents with progressive dysphagia for 3 weeks -Endoscopy on September 22, 2024 showed a distal esophageal mass, biopsy confirmed intramucosal adenocarcinoma. -CT and PET scan showed hypermetabolic esophageal tumor and periesophageal and gastroesophagea ligament adenopathy, no distant metastasis. -Recommend new adjuvant chemotherapy FLOT and durvalumab.

## 2024-10-12 NOTE — Telephone Encounter (Signed)
 Oral Oncology Patient Advocate Encounter  Prior Authorization for promethazine  (PHENERGAN ) 25 MG suppository  has been approved.   [id number] PA# [id number] Effective dates: 10/12/24 through 10/12/25   Patient has been notified via MyChart   Charlott Hamilton,  CPhT-Adv  she/her/hers [facility]  [facility] Services Pharmacy Technician Patient Advocate Specialist III [facility] Phone: [phone]  Fax: [phone] [email or web address]

## 2024-10-12 NOTE — Patient Instructions (Signed)

## 2024-10-12 NOTE — Progress Notes (Signed)
 " [facility]   [phone] [phone]   Clinic Follow up Note   Patient Care Team: Amon Aloysius BRAVO, MD as PCP - General (Internal Medicine) Shana Steffan ORN, MD as Referring Physician (Gastroenterology) [facility] (Ophthalmology) Ardis, Evalene CROME, RN as Oncology Nurse Navigator Lanny Callander, MD as Consulting Physician (Hematology and Oncology)  Date of Service:  10/12/2024  CHIEF COMPLAINT: f/u of esophageal cancer  CURRENT THERAPY:  Neoadjuvant FLOT every 2 weeks  Oncology History   Esophageal cancer (HCC) cTxN1M0,  - Diagnosed in January 2026.  Patient presents with progressive dysphagia for 3 weeks -Endoscopy on September 22, 2024 showed a distal esophageal mass, biopsy confirmed intramucosal adenocarcinoma. -CT and PET scan showed hypermetabolic esophageal tumor and periesophageal and gastroesophagea ligament adenopathy, no distant metastasis. -Recommend new adjuvant chemotherapy FLOT and durvalumab .  Assessment & Plan Malignant neoplasm of lower third of esophagus He experiences expected treatment-related toxicities (nausea, dysphagia, cold sensitivity) without acute oncologic emergencies.  - Maintained close follow-up after each cycle to assess oral intake, hydration, weight, and symptom control. - Reinforced hydration and nutrition as tolerated, including dietary modifications and use of thickeners/protein shakes. - Instructed him to contact clinic for inability to eat/drink, worsening symptoms, or signs of dehydration/weight loss. - Reviewed chemotherapy hold parameters as needed; steroids generally avoided due to immunotherapy, but may be considered for severe nausea.  Chemotherapy-induced nausea and vomiting Persistent nausea since last chemotherapy, with limited response to current antiemetics. He tolerates small sips and some soft foods, but oral intake is reduced. Cold sensitivity noted post-chemotherapy. Nausea impacts intake and  well-being. Phenergan  discussed as a stronger antiemetic, with caution regarding sedation and driving. Steroids may be considered for refractory symptoms, but are generally avoided with immunotherapy. - Ordered ondansetron  (Zofran ) dissolvable tablets for use starting the following morning. - Ordered Phenergan  (promethazine ) suppositories, to be alternated with Compazine  at least six hours apart; avoid concurrent use. - Administered IV Phenergan  in clinic for breakthrough symptoms. - Educated regarding sedating effects of Phenergan  and advised caution with driving. - Encouraged hydration with small, frequent sips and use of ice chips; advised to avoid cold drinks due to cold sensitivity. - Arranged for possible return to clinic for IV fluids if symptoms persist; instructed to call for further support or if symptoms improve. - Will escalate antiemetic regimen or consider additional interventions if persistent symptoms, poor oral intake, or weight loss occur. - Will review steroid use for severe nausea, balancing risks with immunotherapy.  Dysphagia due to esophageal cancer Significant dysphagia limits tolerance of solid and some soft foods. He is on a soft diet with thickeners and protein shakes per dietitian. Some day-to-day improvement, but oral intake remains suboptimal. Ongoing monitoring during neoadjuvant therapy. - Reinforced dietary modifications including thickeners, protein shakes, and soft foods as tolerated. - Encouraged thorough chewing, small bites, and slow eating to minimize aspiration risk and improve tolerance. - Continued dietitian support and follow-up for nutritional guidance. - Instructed to contact clinic for worsening dysphagia, inability to maintain oral intake, or signs of dehydration.  Plan - He came in today for IV fluids, discussed antiemetics, hydration and nutrition supplement. - I called in Zofran  ODT, and Phenergan  suppository - Schedule more IV fluids and  antiemetics this Saturday - He will call me if he needs more IV fluids early next week.   SUMMARY OF ONCOLOGIC HISTORY: Oncology History  Prostate cancer (HCC)  04/27/2024 Initial Diagnosis   Prostate cancer (HCC)   08/13/2024 Genetic  Testing   Negative genetics testing. Report date is 08/13/2024.  The CancerNext-Expanded gene panel offered by [facility] and includes sequencing, rearrangement, and RNA analysis for the following 77 genes: AIP, ALK, APC, ATM, BAP1, BARD1, BMPR1A, BRCA1, BRCA2, BRIP1, CDC73, CDH1, CDK4, CDKN1B, CDKN2A, CEBPA, CHEK2, CTNNA1, DDX41, DICER1, ETV6, FH, FLCN, GATA2, LZTR1, MAX, MBD4, MEN1, MET, MLH1, MSH2, MSH3, MSH6, MUTYH, NF1, NF2, NTHL1, PALB2, PHOX2B, PMS2, POT1, PRKAR1A, PTCH1, PTEN, RAD51C, RAD51D, RB1, RET, RPS20, RUNX1, SDHA, SDHAF2, SDHB, SDHC, SDHD, SMAD4, SMARCA4, SMARCB1, SMARCE1, STK11, SUFU, TMEM127, TP53, TSC1, TSC2, VHL, and WT1 (sequencing and deletion/duplication); AXIN2, CTNNA1, DDX41, EGFR, HOXB13, KIT, MBD4, MITF, MSH3, PDGFRA, POLD1 and POLE (sequencing only); EPCAM and GREM1 (deletion/duplication only). RNA data is routinely analyzed for use in variant interpretation for all genes.   Esophageal cancer (HCC)  09/28/2024 Initial Diagnosis   Esophageal cancer (HCC)   09/28/2024 Cancer Staging   Staging form: Esophagus - Adenocarcinoma, AJCC 8th Edition - Clinical stage from 09/28/2024: Stage III (cT3, cN1, cM0, GX) - Signed by Lanny Callander, MD on 09/28/2024 Histologic grading system: 3 grade system   10/10/2024 -  Chemotherapy   Patient is on Treatment Plan : GASTROESOPHAGEAL Durvalumab  q28d + FLOT q14d / Durvalumab  q28d        Discussed the use of AI scribe software for clinical note transcription with the patient, who gave verbal consent to proceed.  History of Present Illness Tommy Mcbride is a 69 year old male with cT3N1M0 distal esophageal adenocarcinoma undergoing neoadjuvant FLOT chemotherapy and durvalumab  who presents with  persistent chemotherapy-induced nausea and dysphagia impacting oral intake.  He completed his first FLOT cycle two days ago and developed marked nausea during the last minutes of infusion, which has persisted. He has no vomiting unless he tries to eat or drink, when he has immediate regurgitation of solids and liquids. Oral medications are generally retained. He has not tolerated soft foods for several days and is taking only small, frequent sips of fluid, about 3-4 oz at a time. He uses ice chips for hydration but notes new cold sensitivity since chemotherapy. He is worried about hydration and nutrition.  He has tried Compazine  with partial relief and Zofran  without benefit. A dietitian recommended protein shakes and thickeners. Jell-O and pudding are better tolerated than other foods, but these are inconsistently retained. He denies sleep disturbance and attributes drowsiness to medications.  He denies chest pain, shortness of breath, and palpitations. He noted his heart rate felt slightly elevated but has had no other cardiovascular symptoms.  Oct 03, 2024: Oncology follow-up for newly diagnosed distal esophageal adenocarcinoma (cT3N1M0) with progressive dysphagia and odynophagia. Endoscopy and biopsy on September 22, 2024 confirmed diagnosis; PET/CT showed regional lymph node involvement, no distant metastasis. Multidisciplinary plan for neoadjuvant FLOT chemotherapy and durvalumab , with port placement scheduled; transitioning GERD management to pantoprazole  liquid. Tumor board and surgical consult planned; oral surgery for dental cyst scheduled prior to chemotherapy initiation.     All other systems were reviewed with the patient and are negative.  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    Pneumonia     Prostate cancer (HCC)     SURGICAL HISTORY: Past Surgical History:  Procedure Laterality Date   IR IMAGING GUIDED  PORT INSERTION  10/04/2024   JOINT REPLACEMENT Left 08/2020   Knee replacement   REPLACEMENT TOTAL KNEE Left    ~08-2020   ROBOT ASSISTED LAPAROSCOPIC RADICAL PROSTATECTOMY N/A 04/27/2024   Procedure: PROSTATECTOMY, RADICAL, ROBOT-ASSISTED, LAPAROSCOPIC;  Surgeon: Renda Glance, MD;  Location: [facility];  Service: Urology;  Laterality: N/A;  LEVEL 2   TONSILLECTOMY     UMBILICAL HERNIA REPAIR N/A 04/27/2024   Procedure: REPAIR, HERNIA, UMBILICAL, ADULT;  Surgeon: Renda Glance, MD;  Location: [facility];  Service: Urology;  Laterality: N/A;    I have reviewed the social history and family history with the patient and they are unchanged from previous note.  ALLERGIES:  is allergic to penicillins.  MEDICATIONS:  Current Outpatient Medications  Medication Sig Dispense Refill   ondansetron  (ZOFRAN -ODT) 4 MG disintegrating tablet Take 1 tablet (4 mg total) by mouth every 8 (eight) hours as needed for nausea or vomiting. 30 tablet 2   promethazine  (PHENERGAN ) 25 MG suppository Place 1 suppository (25 mg total) rectally every 6 (six) hours as needed for nausea or vomiting. 12 each 1   Acetaminophen  Extra Strength 500 MG/15ML LIQD Take 30 mLs by mouth 3 (three) times daily as needed. 1000 mL 1   azithromycin (ZITHROMAX) 1 g powder Take 1 g by mouth once.     budesonide -formoterol  (BREYNA ) 80-4.5 MCG/ACT inhaler Inhale 2 puffs into the lungs 2 (two) times daily as needed (respiratory issues.). (Patient not taking: Reported on 10/06/2024)     lidocaine -prilocaine  (EMLA ) cream Apply to affected area once 30 g 3   Multiple Vitamin (MULTIVITAMIN WITH MINERALS) TABS tablet Take 1 tablet by mouth daily.     Omega-3 Fatty Acids (SUSTAINABLE VEGAN OMEGA-3 PO) Take by mouth. (Patient not taking: Reported on 10/06/2024)     omeprazole  (PRILOSEC) 10 MG capsule Take 10 mg by mouth daily.      ondansetron  (ZOFRAN ) 8 MG tablet Take 1 tablet (8 mg total) by mouth every 8 (eight) hours as needed for nausea or vomiting. Start on the third day after chemotherapy. 30 tablet 1   pantoprazole  sodium (PROTONIX ) 40 mg Place 40 mg into feeding tube daily. (Patient not taking: Reported on 10/06/2024) 30 packet 2   Policosanol 30 MG TABS Policosanol (Patient not taking: Reported on 10/06/2024)     POLICOSANOL PO Take by mouth. (Patient not taking: Reported on 10/06/2024)     prochlorperazine  (COMPAZINE ) 10 MG tablet Take 1 tablet (10 mg total) by mouth every 6 (six) hours as needed for nausea or vomiting. 30 tablet 1   sucralfate  (CARAFATE ) 1 GM/10ML suspension Take 10 mLs (1 g total) by mouth 4 (four) times daily -  with meals and at bedtime. 420 mL 3   tadalafil (CIALIS) 5 MG tablet Take 5 mg by mouth daily as needed for erectile dysfunction.     triamcinolone  cream (KENALOG ) 0.1 % Apply 1 Application topically 2 (two) times daily as needed. (Patient not taking: Reported on 10/06/2024) 45 g 2   No current facility-administered medications for this visit.    PHYSICAL EXAMINATION: ECOG PERFORMANCE STATUS: 1 - Symptomatic but completely ambulatory  There were no vitals filed for this visit. Wt Readings from Last 3 Encounters:  10/10/24 166 lb 4 oz (75.4 kg)  10/06/24 172 lb 12.8 oz (78.4 kg)  10/04/24 175 lb 8 oz (79.6 kg)     GENERAL:alert, no distress and comfortable SKIN: skin color, texture, turgor are normal, no rashes or significant lesions EYES: normal, Conjunctiva are pink and  non-injected, sclera clear NECK: supple, thyroid  normal size, non-tender, without nodularity LYMPH:  no palpable lymphadenopathy in the cervical, axillary  LUNGS: clear to auscultation and percussion with normal breathing effort HEART: regular rate & rhythm and no murmurs and no lower extremity edema ABDOMEN:abdomen soft, non-tender and normal bowel sounds Musculoskeletal:no cyanosis of digits and no clubbing   NEURO: alert & oriented x 3 with fluent speech, no focal motor/sensory deficits  Physical Exam    LABORATORY DATA:  I have reviewed the data as listed    Latest Ref Rng & Units 10/10/2024    9:56 AM 09/22/2024    5:10 PM 04/28/2024    4:36 AM  CBC  WBC 4.0 - 10.5 K/uL 6.1  6.0    Hemoglobin 13.0 - 17.0 g/dL 86.5  86.4  86.5   Hematocrit 39.0 - 52.0 % 40.0  40.5  40.2   Platelets 150 - 400 K/uL 237  259.0          Latest Ref Rng & Units 10/10/2024    9:56 AM 09/22/2024    5:10 PM 04/21/2024    8:27 AM  CMP  Glucose 70 - 99 mg/dL 877  82  92   BUN 8 - 23 mg/dL 15  14  16    Creatinine 0.61 - 1.24 mg/dL 8.99  8.96  8.94   Sodium 135 - 145 mmol/L 138  137  138   Potassium 3.5 - 5.1 mmol/L 3.7  3.9  3.7   Chloride 98 - 111 mmol/L 101  103  103   CO2 22 - 32 mmol/L 26  27  25    Calcium  8.9 - 10.3 mg/dL 9.3  8.7  9.1   Total Protein 6.5 - 8.1 g/dL 7.3  6.9    Total Bilirubin 0.0 - 1.2 mg/dL 0.6  1.0    Alkaline Phos 38 - 126 U/L 96  83    AST 15 - 41 U/L 22  21    ALT 0 - 44 U/L 16  20        RADIOGRAPHIC STUDIES: I have personally reviewed the radiological images as listed and agreed with the findings in the report. No results found.    No orders of the defined types were placed in this encounter.  All questions were answered. The patient knows to call the clinic with any problems, questions or concerns. No barriers to learning was detected. The total time spent in the appointment was 25 minutes, including review of chart and various tests results, discussions about plan of care and coordination of care plan     Onita Mattock, MD 10/12/2024     "

## 2024-10-13 ENCOUNTER — Inpatient Hospital Stay

## 2024-10-13 ENCOUNTER — Telehealth: Payer: Self-pay

## 2024-10-13 VITALS — BP 112/89 | HR 103 | Temp 98.7°F

## 2024-10-13 DIAGNOSIS — C155 Malignant neoplasm of lower third of esophagus: Secondary | ICD-10-CM

## 2024-10-13 DIAGNOSIS — E860 Dehydration: Secondary | ICD-10-CM

## 2024-10-13 DIAGNOSIS — R110 Nausea: Secondary | ICD-10-CM

## 2024-10-13 MED ORDER — PROCHLORPERAZINE EDISYLATE 10 MG/2ML IJ SOLN
10.0000 mg | Freq: Once | INTRAMUSCULAR | Status: AC
  Administered 2024-10-13: 10 mg via INTRAVENOUS
  Filled 2024-10-13: qty 2

## 2024-10-13 MED ORDER — SODIUM CHLORIDE 0.9 % IV SOLN: Freq: Once | INTRAVENOUS | Status: AC

## 2024-10-13 NOTE — Telephone Encounter (Signed)
 Procedure:ultrasound upper GI Procedure date: 10/16/24 Procedure location: [facility] Arrival Time: 2:30 Spoke with the patient Y/N: Y Any prep concerns? N Has the patient obtained the prep from the pharmacy ? N Do you have a care partner and transportation: Y Any additional concerns? N

## 2024-10-13 NOTE — Patient Instructions (Signed)
 How to Replace Fluids and Minerals (Rehydration) in Older Adults  Rehydration is the replacement of fluids, salts, and minerals in the body (electrolytes) that are lost during dehydration. Dehydration is when there is not enough water  or other fluids in the body. This happens when you lose more fluids than you take in. People who are age 68 or older have a higher risk of dehydration than younger adults. This is because in older age, the body: Is less able to maintain the right amount of water . Does not respond to temperature changes as well. Does not get a sense of thirst as easily or quickly. Other causes include: Not drinking enough fluids. This can occur when you are ill, when you forget to drink, or when you are doing activities that require a lot of energy, especially in hot weather. Conditions that cause loss of water  or other fluids. These include diarrhea, vomiting, sweating, or urinating a lot. Other illnesses, such as fever or infection. Certain medicines, such as those that remove excess fluid from the body (diuretics). Symptoms of mild or moderate dehydration may include thirst, dry lips and mouth, and dizziness. Symptoms of severe dehydration may include increased heart rate, confusion, fainting, and not urinating. In severe cases, you may need to get fluids through an IV at the hospital. For mild or moderate cases, you can usually rehydrate at home by drinking certain fluids as told by your health care provider. What are the risks? Rehydration is usually safe. Taking in too much fluid (overhydration) can be a problem but is rare. Overhydration can cause an imbalance of electrolytes in the body, kidney failure, fluid in the lungs, or a decrease in salt (sodium) levels in the body. Supplies needed: You will need an oral rehydration solution (ORS) if your health care provider tells you to use one. This is a drink to treat dehydration. It can be found in pharmacies and retail stores. How  to rehydrate Fluids Follow instructions from your health care provider about what to drink. The kind of fluid and the amount you should drink depend on your condition. In general, you should choose drinks that you prefer. If told by your health care provider, drink an ORS. Make an ORS by following instructions on the package. Start by drinking small amounts, about  cup (120 mL) every 5-10 minutes. Slowly increase how much you drink until you have taken in the amount recommended by your health care provider. Drink enough clear fluids to keep your urine pale yellow. If you were told to drink an ORS, finish it first, then start slowly drinking other clear fluids. Drink fluids such as: Water . This includes sparkling and flavored water . Drinking only water  can lead to having too little sodium in your body (hyponatremia). Follow the advice of your health care provider. Water  from ice chips you suck on. Fruit juice with water  added to it(diluted). Sports drinks. Hot or cold herbal teas. Broth-based soups. Coffee. Milk or milk products. Food Follow instructions from your health care provider about what to eat while you rehydrate. Your health care provider may recommend that you slowly begin eating regular foods in small amounts. Eat foods that contain a healthy balance of electrolytes, such as bananas, oranges, potatoes, tomatoes, and spinach. Avoid foods that are greasy or contain a lot of sugar. In some cases, you may get nutrition through a feeding tube that is passed through your nose and into your stomach (nasogastric tube, or NG tube). This may be done if you have  uncontrolled vomiting or diarrhea. Drinks to avoid  Certain drinks may make dehydration worse. While you rehydrate, avoid drinking alcohol. How to tell if you are recovering from dehydration You may be getting better if: You are urinating more often than before you started rehydrating. Your urine is pale yellow. Your energy level  improves. You vomit less often. You have diarrhea less often. Your appetite improves or returns to normal. You feel less dizzy or light-headed. Your skin tone and color start to look more normal. Follow these instructions at home: Take over-the-counter and prescription medicines only as told by your health care provider. Do not take sodium tablets. Doing this can lead to having too much sodium in your body (hypernatremia). Contact a health care provider if: You continue to have symptoms of mild or moderate dehydration, such as: Thirst. Dry lips. Slightly dry mouth. Dizziness. Dark urine or less urine than usual. Muscle cramps. You continue to vomit or have diarrhea. Get help right away if: You have symptoms of dehydration that get worse. You have a fever. You have a severe headache. You have been vomiting and have problems, such as: Your vomiting gets worse. Your vomit includes blood or green matter (bile). You cannot eat or drink without vomiting. You have problems with urination or bowel movements, such as: Diarrhea that gets worse. Blood in your stool (feces). This may cause stool to look black and tarry. Not urinating, or urinating only a small amount of very dark urine, within 6-8 hours. You have trouble breathing. You have symptoms that get worse with treatment. These symptoms may be an emergency. Get help right away. Call 911. Do not wait to see if the symptoms will go away. Do not drive yourself to the hospital. This information is not intended to replace advice given to you by your health care provider. Make sure you discuss any questions you have with your health care provider. Document Revised: 07/10/2024 Document Reviewed: 01/12/2022 Elsevier Patient Education  2025 [employer].

## 2024-10-13 NOTE — Progress Notes (Signed)
 PATIENT NAVIGATOR PROGRESS NOTE  Name: Tommy Mcbride Date: 10/13/2024 MRN: [id number]  DOB: [birth year]  Patient is established with a treatment plan and is actively engaged in care. Nurse Navigator services not currently indicated at this time. Will re-evaluate if needs change or if additional support is requested.

## 2024-10-14 ENCOUNTER — Inpatient Hospital Stay

## 2024-10-15 ENCOUNTER — Telehealth (HOSPITAL_COMMUNITY): Payer: Self-pay

## 2024-10-15 NOTE — Telephone Encounter (Signed)
 We understand the need for rescheduling. GM

## 2024-10-15 NOTE — Telephone Encounter (Signed)
 Tommy Mcbride was scheduled for upper EUS  with Dr. Wilhelmenia on 2/2 (date), at [facility] hospital.   Patient/or family called on 2/1 to reschedule their procedure due to unsafe road conditions   Dr Wilhelmenia & office notified. Patient instructed to call physician's office to reschedule their procedure. Patient demonstrated understanding.

## 2024-10-16 ENCOUNTER — Encounter (HOSPITAL_COMMUNITY): Admission: RE | Payer: Self-pay | Source: Home / Self Care

## 2024-10-16 ENCOUNTER — Ambulatory Visit (HOSPITAL_COMMUNITY): Admission: RE | Admit: 2024-10-16 | Source: Home / Self Care | Admitting: Gastroenterology

## 2024-10-16 NOTE — Assessment & Plan Note (Signed)
 cTxN1M0,  - Diagnosed in January 2026.  Patient presents with progressive dysphagia for 3 weeks -Endoscopy on September 22, 2024 showed a distal esophageal mass, biopsy confirmed intramucosal adenocarcinoma. -CT and PET scan showed hypermetabolic esophageal tumor and periesophageal and gastroesophagea ligament adenopathy, no distant metastasis. -Recommend new adjuvant chemotherapy FLOT and durvalumab.

## 2024-10-17 ENCOUNTER — Other Ambulatory Visit: Payer: Self-pay

## 2024-10-17 ENCOUNTER — Inpatient Hospital Stay: Admitting: Hematology

## 2024-10-17 DIAGNOSIS — C155 Malignant neoplasm of lower third of esophagus: Secondary | ICD-10-CM | POA: Diagnosis not present

## 2024-10-18 ENCOUNTER — Ambulatory Visit: Admitting: Thoracic Surgery (Cardiothoracic Vascular Surgery)

## 2024-10-18 ENCOUNTER — Encounter (HOSPITAL_COMMUNITY): Payer: Self-pay | Admitting: Thoracic Surgery (Cardiothoracic Vascular Surgery)

## 2024-10-18 ENCOUNTER — Other Ambulatory Visit: Payer: Self-pay

## 2024-10-18 ENCOUNTER — Inpatient Hospital Stay

## 2024-10-18 ENCOUNTER — Other Ambulatory Visit: Payer: Self-pay | Admitting: *Deleted

## 2024-10-18 ENCOUNTER — Encounter: Payer: Self-pay | Admitting: Hematology

## 2024-10-18 VITALS — BP 122/79 | HR 84 | Temp 97.9°F | Resp 16 | Ht 72.0 in | Wt 161.0 lb

## 2024-10-18 VITALS — BP 127/71 | HR 90 | Resp 20 | Ht 72.0 in | Wt 161.0 lb

## 2024-10-18 DIAGNOSIS — C155 Malignant neoplasm of lower third of esophagus: Secondary | ICD-10-CM

## 2024-10-18 DIAGNOSIS — R110 Nausea: Secondary | ICD-10-CM

## 2024-10-18 DIAGNOSIS — E860 Dehydration: Secondary | ICD-10-CM

## 2024-10-18 LAB — COMPREHENSIVE METABOLIC PANEL WITH GFR
ALT: 25 U/L (ref 0–44)
AST: 23 U/L (ref 15–41)
Albumin: 4.5 g/dL (ref 3.5–5.0)
Alkaline Phosphatase: 137 U/L — ABNORMAL HIGH (ref 38–126)
Anion gap: 14 (ref 5–15)
BUN: 12 mg/dL (ref 8–23)
CO2: 23 mmol/L (ref 22–32)
Calcium: 9.5 mg/dL (ref 8.9–10.3)
Chloride: 98 mmol/L (ref 98–111)
Creatinine, Ser: 1.07 mg/dL (ref 0.61–1.24)
GFR, Estimated: >60 mL/min
Glucose, Bld: 109 mg/dL — ABNORMAL HIGH (ref 70–99)
Potassium: 3.6 mmol/L (ref 3.5–5.1)
Sodium: 135 mmol/L (ref 135–145)
Total Bilirubin: 0.4 mg/dL (ref 0.0–1.2)
Total Protein: 7.4 g/dL (ref 6.5–8.1)

## 2024-10-18 LAB — CBC WITH DIFFERENTIAL/PLATELET
Abs Immature Granulocytes: 0.78 10*3/uL — ABNORMAL HIGH (ref 0.00–0.07)
Basophils Absolute: 0 10*3/uL (ref 0.0–0.1)
Basophils Relative: 0 %
Eosinophils Absolute: 0 10*3/uL (ref 0.0–0.5)
Eosinophils Relative: 0 %
HCT: 40.9 % (ref 39.0–52.0)
Hemoglobin: 13.9 g/dL (ref 13.0–17.0)
Immature Granulocytes: 7 %
Lymphocytes Relative: 12 %
Lymphs Abs: 1.4 10*3/uL (ref 0.7–4.0)
MCH: 28.4 pg (ref 26.0–34.0)
MCHC: 34 g/dL (ref 30.0–36.0)
MCV: 83.6 fL (ref 80.0–100.0)
Monocytes Absolute: 1.7 10*3/uL — ABNORMAL HIGH (ref 0.1–1.0)
Monocytes Relative: 14 %
Neutro Abs: 8 10*3/uL — ABNORMAL HIGH (ref 1.7–7.7)
Neutrophils Relative %: 67 %
Platelets: 266 10*3/uL (ref 150–400)
RBC: 4.89 MIL/uL (ref 4.22–5.81)
RDW: 12.9 % (ref 11.5–15.5)
Smear Review: NORMAL
WBC: 11.9 10*3/uL — ABNORMAL HIGH (ref 4.0–10.5)
nRBC: 0 % (ref 0.0–0.2)

## 2024-10-18 MED ORDER — SODIUM CHLORIDE 0.9% FLUSH: 10.0000 mL | Freq: Once | INTRAVENOUS | Status: DC | PRN

## 2024-10-18 MED ORDER — SODIUM CHLORIDE 0.9 % IV SOLN: Freq: Once | INTRAVENOUS | Status: AC

## 2024-10-18 MED ORDER — PROCHLORPERAZINE EDISYLATE 10 MG/2ML IJ SOLN: 10.0000 mg | Freq: Once | INTRAMUSCULAR | Status: DC

## 2024-10-18 NOTE — Progress Notes (Signed)
 SDW call  Patient was given pre-op instructions over the phone. Patient verbalized understanding of instructions provided.  Denied any SOB, fever or cough   PCP - Dr. Aloysius Mech Oncologist: Dr. Onita Mattock Cardiologist -  Pulmonary:    PPM/ICD - denies Device Orders - na Rep Notified - na   Chest x-ray - DOS, 10/19/2024 EKG -  04/21/2024 Stress Test - ECHO - 03/12/2022, CE Cardiac Cath -   Sleep Study/sleep apnea/CPAP: denies  Non-diabetic  Blood Thinner Instructions: denies Aspirin Instructions:denies   ERAS Protcol - NPO  Anesthesia review: Yes. Asthma, active chemo  Your procedure is scheduled on Thursday October 19, 2024  Report to [facility] Main Entrance A at  0800  A.M., then check in with the Admitting office.  Call this number if you have problems the morning of surgery:  [phone]   If you have any questions prior to your surgery date call [phone]: Open Monday-Friday 8am-4pm If you experience any cold or flu symptoms such as cough, fever, chills, shortness of breath, etc. between now and your scheduled surgery, please notify us  at the above number    Remember:  Do not eat or drink after midnight the night before your surgery  Take these medicines the morning of surgery with A SIP OF WATER :  None per Bernardino Sprang, RN at Dr. Lang office  As of today, STOP taking any Aspirin (unless otherwise instructed by your surgeon) Aleve, Naproxen, Ibuprofen, Motrin, Advil, Goody's, BC's, all herbal medications, fish oil, and all vitamins.

## 2024-10-18 NOTE — H&P (View-Only) (Signed)
" ° °   °  [address] [place] [address]             [phone]       Tommy Mcbride [facility] Medical Record #[id number] Date of Birth: 1956/08/31  Referring: Albertus Gordy HERO, MD Primary Care: Amon Aloysius BRAVO, MD Primary Cardiologist:None  Reason for visit:   follow-up  History of Present Illness:     Tommy Mcbride present for follow-up.  He has been having severe nausea when attempting PO intake.  He is down 10lbs this week.  Physical Exam: BP 127/71   Pulse 90   Resp 20   Ht 6' (1.829 m)   Wt 161 lb (73 kg)   SpO2 96% Comment: RA  BMI 21.84 kg/m   Alert NAD Abdomen, ND no peripheral edema     Assessment / Plan:   69 y.o. male with esophageal cancer.  He recently started chemotherapy, and is intolerant to PO intake.  He would like to proceed with an upper endoscopy, PEG tube placement, and possible laparoscopy.  The risks and benefits have been discussed.     [name] 10/18/2024 3:53 PM       "

## 2024-10-18 NOTE — Progress Notes (Signed)
" ° °   °  [address] [place] [address]             [phone]       Tommy Mcbride [facility] Medical Record #[id number] Date of Birth: 1956/08/31  Referring: Albertus Gordy HERO, MD Primary Care: Amon Aloysius BRAVO, MD Primary Cardiologist:None  Reason for visit:   follow-up  History of Present Illness:     Tommy Mcbride present for follow-up.  He has been having severe nausea when attempting PO intake.  He is down 10lbs this week.  Physical Exam: BP 127/71   Pulse 90   Resp 20   Ht 6' (1.829 m)   Wt 161 lb (73 kg)   SpO2 96% Comment: RA  BMI 21.84 kg/m   Alert NAD Abdomen, ND no peripheral edema     Assessment / Plan:   69 y.o. male with esophageal cancer.  He recently started chemotherapy, and is intolerant to PO intake.  He would like to proceed with an upper endoscopy, PEG tube placement, and possible laparoscopy.  The risks and benefits have been discussed.     Tommy Mcbride 10/18/2024 3:53 PM       "

## 2024-10-18 NOTE — Telephone Encounter (Signed)
 EUS scheduled, pt instructed and medications reviewed.  Patient instructions mailed to home.  Patient to call with any questions or concerns.

## 2024-10-18 NOTE — Telephone Encounter (Signed)
 EUS has been moved to 12/07/24 at 230 pm at [facility] with GM

## 2024-10-19 ENCOUNTER — Inpatient Hospital Stay (HOSPITAL_COMMUNITY)

## 2024-10-19 ENCOUNTER — Encounter (HOSPITAL_COMMUNITY): Payer: Self-pay | Admitting: Thoracic Surgery (Cardiothoracic Vascular Surgery)

## 2024-10-19 ENCOUNTER — Inpatient Hospital Stay (HOSPITAL_COMMUNITY): Admitting: Certified Registered Nurse Anesthetist

## 2024-10-19 ENCOUNTER — Observation Stay (HOSPITAL_COMMUNITY)
Admission: RE | Admit: 2024-10-19 | Discharge: 2024-10-20 | Disposition: A | Source: Home / Self Care | Attending: Thoracic Surgery (Cardiothoracic Vascular Surgery) | Admitting: Thoracic Surgery (Cardiothoracic Vascular Surgery)

## 2024-10-19 ENCOUNTER — Encounter (HOSPITAL_COMMUNITY)
Admission: RE | Disposition: A | Payer: Self-pay | Source: Home / Self Care | Attending: Thoracic Surgery (Cardiothoracic Vascular Surgery)

## 2024-10-19 ENCOUNTER — Other Ambulatory Visit: Payer: Self-pay

## 2024-10-19 DIAGNOSIS — Z931 Gastrostomy status: Secondary | ICD-10-CM

## 2024-10-19 DIAGNOSIS — E43 Unspecified severe protein-calorie malnutrition: Secondary | ICD-10-CM | POA: Insufficient documentation

## 2024-10-19 DIAGNOSIS — Z9889 Other specified postprocedural states: Principal | ICD-10-CM

## 2024-10-19 DIAGNOSIS — C155 Malignant neoplasm of lower third of esophagus: Secondary | ICD-10-CM

## 2024-10-19 LAB — APTT: aPTT: 27 s (ref 24–36)

## 2024-10-19 LAB — PROTIME-INR
INR: 1.1 (ref 0.8–1.2)
Prothrombin Time: 14.3 s (ref 11.4–15.2)

## 2024-10-19 LAB — GLUCOSE, CAPILLARY: Glucose-Capillary: 107 mg/dL — ABNORMAL HIGH (ref 70–99)

## 2024-10-19 MED ORDER — ACETAMINOPHEN 160 MG/5ML PO SOLN: 650.0000 mg | Freq: Four times a day (QID) | ORAL | Status: DC | PRN

## 2024-10-19 MED ORDER — METOCLOPRAMIDE HCL 5 MG/ML IJ SOLN
10.0000 mg | Freq: Four times a day (QID) | INTRAMUSCULAR | Status: DC
  Administered 2024-10-19 – 2024-10-20 (×4): 10 mg via INTRAVENOUS
  Filled 2024-10-19 (×4): qty 2

## 2024-10-19 MED ORDER — DEXAMETHASONE SOD PHOSPHATE PF 10 MG/ML IJ SOLN
INTRAMUSCULAR | Status: DC | PRN
  Administered 2024-10-19: 8 mg via INTRAVENOUS

## 2024-10-19 MED ORDER — FENTANYL CITRATE (PF) 100 MCG/2ML IJ SOLN: 25.0000 ug | INTRAMUSCULAR | Status: DC | PRN

## 2024-10-19 MED ORDER — ENOXAPARIN SODIUM 40 MG/0.4ML IJ SOSY
40.0000 mg | PREFILLED_SYRINGE | Freq: Every day | INTRAMUSCULAR | Status: DC
  Administered 2024-10-20: 40 mg via SUBCUTANEOUS
  Filled 2024-10-19: qty 0.4

## 2024-10-19 MED ORDER — ACETAMINOPHEN 500 MG PO TABS
1000.0000 mg | ORAL_TABLET | Freq: Four times a day (QID) | ORAL | Status: DC
  Administered 2024-10-20: 1000 mg via ORAL
  Filled 2024-10-19 (×3): qty 2

## 2024-10-19 MED ORDER — STERILE WATER FOR IRRIGATION IR SOLN
Status: DC | PRN
  Administered 2024-10-19: 1000 mL

## 2024-10-19 MED ORDER — ROCURONIUM BROMIDE 10 MG/ML (PF) SYRINGE
PREFILLED_SYRINGE | INTRAVENOUS | Status: DC | PRN
  Administered 2024-10-19: 20 mg via INTRAVENOUS
  Administered 2024-10-19: 10 mg via INTRAVENOUS

## 2024-10-19 MED ORDER — OXYCODONE HCL 5 MG/5ML PO SOLN: 5.0000 mg | ORAL | Status: DC | PRN

## 2024-10-19 MED ORDER — ORAL CARE MOUTH RINSE: 15.0000 mL | Freq: Once | OROMUCOSAL | Status: AC

## 2024-10-19 MED ORDER — PANTOPRAZOLE SODIUM 40 MG PO TBEC
40.0000 mg | DELAYED_RELEASE_TABLET | Freq: Every day | ORAL | Status: DC
  Administered 2024-10-19: 40 mg via ORAL
  Filled 2024-10-19: qty 1

## 2024-10-19 MED ORDER — CEFAZOLIN SODIUM-DEXTROSE 2-4 GM/100ML-% IV SOLN
2.0000 g | Freq: Three times a day (TID) | INTRAVENOUS | Status: AC
  Administered 2024-10-19 – 2024-10-20 (×2): 2 g via INTRAVENOUS
  Filled 2024-10-19 (×4): qty 100

## 2024-10-19 MED ORDER — PHENYLEPHRINE HCL (PRESSORS) 10 MG/ML IV SOLN
INTRAVENOUS | Status: AC
  Filled 2024-10-19: qty 1

## 2024-10-19 MED ORDER — CHLORHEXIDINE GLUCONATE 0.12 % MT SOLN
OROMUCOSAL | Status: AC
  Administered 2024-10-19: 15 mL via OROMUCOSAL
  Filled 2024-10-19: qty 15

## 2024-10-19 MED ORDER — LIDOCAINE 1 % OPTIME INJ - NO CHARGE
INTRAMUSCULAR | Status: DC | PRN
  Administered 2024-10-19: 10 mL

## 2024-10-19 MED ORDER — LACTATED RINGERS IV SOLN: INTRAVENOUS | Status: DC

## 2024-10-19 MED ORDER — PROPOFOL 10 MG/ML IV BOLUS
INTRAVENOUS | Status: DC | PRN
  Administered 2024-10-19: 140 mg via INTRAVENOUS

## 2024-10-19 MED ORDER — SUGAMMADEX SODIUM 200 MG/2ML IV SOLN
INTRAVENOUS | Status: DC | PRN
  Administered 2024-10-19: 180 mg via INTRAVENOUS

## 2024-10-19 MED ORDER — MIDAZOLAM HCL 2 MG/2ML IJ SOLN
INTRAMUSCULAR | Status: AC
  Filled 2024-10-19: qty 2

## 2024-10-19 MED ORDER — SUCCINYLCHOLINE CHLORIDE 200 MG/10ML IV SOSY
PREFILLED_SYRINGE | INTRAVENOUS | Status: DC | PRN
  Administered 2024-10-19: 120 mg via INTRAVENOUS

## 2024-10-19 MED ORDER — PHENYLEPHRINE 80 MCG/ML (10ML) SYRINGE FOR IV PUSH (FOR BLOOD PRESSURE SUPPORT)
PREFILLED_SYRINGE | INTRAVENOUS | Status: DC | PRN
  Administered 2024-10-19: 120 ug via INTRAVENOUS
  Administered 2024-10-19 (×4): 80 ug via INTRAVENOUS

## 2024-10-19 MED ORDER — FENTANYL CITRATE (PF) 250 MCG/5ML IJ SOLN
INTRAMUSCULAR | Status: DC | PRN
  Administered 2024-10-19: 100 ug via INTRAVENOUS
  Administered 2024-10-19 (×2): 50 ug via INTRAVENOUS

## 2024-10-19 MED ORDER — FENTANYL CITRATE (PF) 250 MCG/5ML IJ SOLN
INTRAMUSCULAR | Status: AC
  Filled 2024-10-19: qty 5

## 2024-10-19 MED ORDER — ONDANSETRON HCL 4 MG/2ML IJ SOLN
4.0000 mg | Freq: Four times a day (QID) | INTRAMUSCULAR | Status: DC | PRN
  Administered 2024-10-19 – 2024-10-20 (×2): 4 mg via INTRAVENOUS
  Filled 2024-10-19 (×2): qty 2

## 2024-10-19 MED ORDER — PROPOFOL 10 MG/ML IV BOLUS
INTRAVENOUS | Status: AC
  Filled 2024-10-19: qty 20

## 2024-10-19 MED ORDER — PHENYLEPHRINE HCL-NACL 20-0.9 MG/250ML-% IV SOLN
INTRAVENOUS | Status: DC | PRN
  Administered 2024-10-19: 25 ug/min via INTRAVENOUS

## 2024-10-19 MED ORDER — MORPHINE SULFATE (PF) 2 MG/ML IV SOLN: 2.0000 mg | INTRAVENOUS | Status: DC | PRN

## 2024-10-19 MED ORDER — ONDANSETRON HCL 4 MG/2ML IJ SOLN
INTRAMUSCULAR | Status: DC | PRN
  Administered 2024-10-19: 4 mg via INTRAVENOUS

## 2024-10-19 MED ORDER — CHLORHEXIDINE GLUCONATE 0.12 % MT SOLN: 15.0000 mL | Freq: Once | OROMUCOSAL | Status: AC

## 2024-10-19 MED ORDER — LIDOCAINE HCL (CARDIAC) PF 100 MG/5ML IV SOSY
PREFILLED_SYRINGE | INTRAVENOUS | Status: DC | PRN
  Administered 2024-10-19: 80 mg via INTRAVENOUS

## 2024-10-19 MED ORDER — MIDAZOLAM HCL (PF) 2 MG/2ML IJ SOLN
INTRAMUSCULAR | Status: DC | PRN
  Administered 2024-10-19: 2 mg via INTRAVENOUS

## 2024-10-19 MED ORDER — ACETAMINOPHEN 160 MG/5ML PO SOLN
1000.0000 mg | Freq: Four times a day (QID) | ORAL | Status: DC
  Administered 2024-10-19 – 2024-10-20 (×3): 1000 mg via ORAL
  Filled 2024-10-19 (×3): qty 40.6

## 2024-10-19 MED ORDER — OSMOLITE 1.2 CAL PO LIQD
1000.0000 mL | ORAL | Status: DC
  Administered 2024-10-19: 1000 mL
  Filled 2024-10-19 (×2): qty 1000

## 2024-10-19 MED ORDER — FLUTICASONE FUROATE-VILANTEROL 100-25 MCG/ACT IN AEPB
1.0000 | INHALATION_SPRAY | Freq: Every day | RESPIRATORY_TRACT | Status: DC
  Filled 2024-10-19: qty 28

## 2024-10-19 MED ORDER — PANTOPRAZOLE SODIUM 40 MG PO TBEC: 40.0000 mg | DELAYED_RELEASE_TABLET | Freq: Every day | ORAL | Status: DC

## 2024-10-19 MED ORDER — 0.9 % SODIUM CHLORIDE (POUR BTL) OPTIME
TOPICAL | Status: DC | PRN
  Administered 2024-10-19: 1000 mL

## 2024-10-19 NOTE — Interval H&P Note (Signed)
 History and Physical Interval Note:  10/19/2024 1:36 PM  Tommy Mcbride  has presented today for surgery, with the diagnosis of lung cancer.  The various methods of treatment have been discussed with the patient and family. After consideration of risks, benefits and other options for treatment, the patient has consented to  Procedures with comments: EGD (ESOPHAGOGASTRODUODENOSCOPY) (N/A) CREATION, JEJUNOSTOMY (N/A) - PEG TUBE PLACEMENT LAPAROSCOPY, DIAGNOSTIC (N/A) as a surgical intervention.  The patient's history has been reviewed, patient examined, no change in status, stable for surgery.  I have reviewed the patient's chart and labs.  Questions were answered to the patient's satisfaction.     [name] MALVA Rayas

## 2024-10-19 NOTE — Op Note (Signed)
" ° °   °  [address] [place] [address]             [phone]      10/19/2024  Patient:  Elspeth CROME Salido Pre-Op Dx: Esophageal cancer Dysphagia Weight loss malnutrition   Post-op Dx:  same Procedure: - Esophagogastroscopy - 33F Percutaneous gastroscopy tube placement   Surgeon and Role:      * Nikkita Adeyemi, Linnie KIDD, MD - Primary Anesthesia  general EBL:  minimal  Blood Administration: none Specimen:  none   Counts: correct   Indications: 69 y.o. male with esophageal cancer.  He recently started chemotherapy, and is intolerant to PO intake.  He would like to proceed with an upper endoscopy, PEG tube placement, and possible laparoscopy.  The risks and benefits have been discussed.    Findings: Tumor noted.  PEG tube secured at 4cm  Operative Technique: After the risks, benefits and alternatives were thoroughly discussed, the patient was brought to the operative theatre.  Anesthesia was induced. The patient was prepped and draped in normal sterile fashion.  An appropriate surgical pause was performed, and pre-operative antibiotics were dosed accordingly.  The gastroscope was advanced through the oropharynx into the cervical esophagus under direct visualization.  The scope was passed into the stomach.    1:1 external compression through the abdominal wall was evident, and we were able to trans-illuminate through the skin.  Using Seldinger technique, a needle was passed through the skin, and a wire was passed and grasped via the gastroscope.  The PEG tube was then positioned using a pull technique.  The PEG tube button was then secured at the skin at 4cm.     The patient tolerated the procedure without any immediate complications, and was transferred to the PACU in stable condition.  Rubie Ficco O Bruchy Mikel  "

## 2024-10-19 NOTE — Hospital Course (Addendum)
 History of Present Illness:  Tommy Mcbride is a 69 yo male with history HLD, Paroxysmal SVT, Prostate Cancer, Dysphagia and weight loss.  The patient developed complaints of episodes of food getting stuck when eating too quickly.  This has been lifelong, however post Prostatectomy performed in August of 2025, these episodes have been occurring more frequently with worsening over the past 2-3 months.  He was evaluated by GI who recommended EGD for possible dilation and further evaluation.  This was performed on 09/22/2024 during which time he was noted to have an esophageal mass.  A CT of chest, abdomen, and pelvis was obtained to further evaluation mass.  This was performed on 09/25/2024 and showed mural thickness of the lower esophagus spanning 6.1 cm above the GE junction.  He was also noted to have enlarged lymph nodes suspicious for metastasis.  PET CT scan was also performed and showed increasingly hypermetabolic distal esophageal mass with new hypermetabolic distal periesophageal and gastrohepatic ligament lymph nodes compatible with disease progression.  He was referred to [facility] for evaluation and it was felt he would be a good surgical candidate post adjuvant treatment.  He developed complaints of severe nausea with oral intake.  He had lost 10 lbs in previous week.  He was again evaluated by Dr. Shyrl who recommended EGD with placement of PEG tube for nutritional supplementation.  The risks and benefits of the procedure were explained to the patient and she was agreeable to proceed.  Hospital Course:    [name] presented to [facility] on 10/19/2024.  He was taken to the operating room and underwent EGD and placement of PEG tube.  He tolerated the procedure without difficulty, was extubated and taken the OR in stable condition.  He was initiated on tube feedings overnight and will be titrated to goal.  Home health arrangements have been made.

## 2024-10-19 NOTE — Transfer of Care (Signed)
 Immediate Anesthesia Transfer of Care Note  Patient: Tommy Mcbride  Procedure(s) Performed: EGD (ESOPHAGOGASTRODUODENOSCOPY) INSERTION PERCUTANEOUS ENDOSCOPIC GASTROSTOMY (PEG) TUBE (Abdomen)  Patient Location: PACU  Anesthesia Type:General  Level of Consciousness: awake, drowsy, and patient cooperative  Airway & Oxygen Therapy: Patient Spontanous Breathing  Post-op Assessment: Report given to RN and Post -op Vital signs reviewed and stable  Post vital signs: Reviewed and stable  Last Vitals:  Vitals Value Taken Time  BP 146/87 1500  Temp    Pulse 91 10/19/24 14:59  Resp 11   SpO2 98 % 10/19/24 14:59  Vitals shown include unfiled device data.  Last Pain:  Vitals:   10/19/24 0930  TempSrc:   PainSc: 0-No pain      Patients Stated Pain Goal: 0 (10/19/24 0930)  Complications: No notable events documented.

## 2024-10-19 NOTE — Anesthesia Postprocedure Evaluation (Signed)
"   Anesthesia Post Note  Patient: Matteo Banke Papin  Procedure(s) Performed: EGD (ESOPHAGOGASTRODUODENOSCOPY) INSERTION PERCUTANEOUS ENDOSCOPIC GASTROSTOMY (PEG) TUBE (Abdomen)     Patient location during evaluation: PACU Anesthesia Type: General Level of consciousness: awake and alert Pain management: pain level controlled Vital Signs Assessment: post-procedure vital signs reviewed and stable Respiratory status: spontaneous breathing, nonlabored ventilation and respiratory function stable Cardiovascular status: blood pressure returned to baseline and stable Postop Assessment: no apparent nausea or vomiting Anesthetic complications: no   No notable events documented.  Last Vitals:  Vitals:   10/19/24 1530 10/19/24 1600  BP: 136/81 134/81  Pulse: 84 85  Resp: 12 14  Temp: 36.7 C   SpO2: 94% 94%    Last Pain:  Vitals:   10/19/24 1500  TempSrc:   PainSc: 0-No pain                 Herbie Lehrmann      "

## 2024-10-19 NOTE — Anesthesia Preprocedure Evaluation (Signed)
"                                    Anesthesia Evaluation  Patient identified by MRN, date of birth, ID band Patient awake    Reviewed: Allergy & Precautions, H&P , NPO status , Patient's Chart, lab work & pertinent test results  Airway Mallampati: II  TM Distance: >3 FB Neck ROM: Full    Dental  (+) Teeth Intact, Dental Advisory Given   Pulmonary neg shortness of breath, asthma , neg sleep apnea, neg COPD, neg recent URI   breath sounds clear to auscultation       Cardiovascular negative cardio ROS  Rhythm:Regular     Neuro/Psych negative neurological ROS  negative psych ROS   GI/Hepatic Neg liver ROS,,,  Endo/Other  negative endocrine ROS    Renal/GU negative Renal ROS     Musculoskeletal  (+) Arthritis ,    Abdominal   Peds  Hematology negative hematology ROS (+)   Anesthesia Other Findings  Malignant neoplasm of esophagus  Reproductive/Obstetrics                              Anesthesia Physical Anesthesia Plan  ASA: 3  Anesthesia Plan: General   Post-op Pain Management: Minimal or no pain anticipated   Induction: Intravenous  PONV Risk Score and Plan: 2 and Ondansetron  and Dexamethasone   Airway Management Planned: Oral ETT  Additional Equipment: None  Intra-op Plan:   Post-operative Plan: Extubation in OR  Informed Consent: I have reviewed the patients History and Physical, chart, labs and discussed the procedure including the risks, benefits and alternatives for the proposed anesthesia with the patient or authorized representative who has indicated his/her understanding and acceptance.     Dental advisory given  Plan Discussed with: CRNA  Anesthesia Plan Comments:         Anesthesia Quick Evaluation  "

## 2024-10-19 NOTE — Discharge Summary (Incomplete)
 "       [address] [place] [address]             [phone]        Physician Discharge Summary  Patient ID: Tommy Mcbride MRN: [id number] DOB/AGE: Aug 27, 1956 69 y.o.  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   Annual physical exam 09/24/2014   DJD (degenerative joint disease)    Hyperlipidemia    Discharge Diagnoses:  Patient Active Problem List   Diagnosis Date Noted   Protein-calorie malnutrition, severe 10/20/2024   S/P percutaneous endoscopic gastrostomy (PEG) tube placement (HCC) 10/20/2024   Dehydration 10/11/2024   Nausea without vomiting 10/11/2024   Esophageal cancer (HCC) 09/28/2024   Genetic testing 08/16/2024   Dysphagia 08/08/2024   Prostate cancer (HCC) 04/27/2024   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  The patient developed complaints of episodes of food getting stuck when eating too quickly.  This has been lifelong, however post Prostatectomy performed in  August of 2025, these episodes have been occurring more frequently with worsening over the past 2-3 months.  He was evaluated by GI who recommended EGD for possible dilation and further evaluation.  This was performed on 09/22/2024 during which time he was noted to have an esophageal mass.  A CT of chest, abdomen, and pelvis was obtained to further evaluation mass.  This was performed on 09/25/2024 and showed mural thickness of the lower esophagus spanning 6.1 cm above the GE junction.  He was also noted to have enlarged lymph nodes suspicious for metastasis.  PET CT scan was also performed and showed increasingly hypermetabolic distal esophageal mass with new hypermetabolic distal periesophageal and gastrohepatic ligament lymph nodes compatible with disease progression.  He was referred to [facility] for evaluation and it was felt he would be a good surgical candidate post adjuvant treatment.  He developed complaints of severe nausea with oral intake.  He had lost 10 lbs in previous week.  He was again evaluated by Dr. Shyrl who recommended EGD with placement of PEG tube for nutritional supplementation.  The risks and benefits of the procedure were explained to the patient and she was agreeable to proceed.  Hospital Course:    Elisa Sorlie presented to [facility] on 10/19/2024.  He was taken to the operating room and underwent EGD and placement of PEG tube.  He tolerated the procedure without difficulty, was extubated and taken the OR in stable condition.  He was initiated on tube feedings overnight and will be titrated to goal.  Dietician has  been consulted to guide in goal rate and protein supplementation to maintain nutritional requirements.  There recommendations are as followed: DOCUMENTATION CODES:  Severe malnutrition in context of acute illness/injury   INTERVENTION:  Stop continuous tube feeding; transition to bolus feedings via PEG: Osmolite 1.5 120 ml (1/2  carton) 5 times per day, increase by 120 ml every 3 days to goal of 360 ml (1.5 cartons) 5 times per day. Free water  flushes 60 ml before and after each bolus. Will need an additional 550 ml water  via tube divided throughout the day if unable to take in that much PO.  Provides 2663 kcal, 112 gm protein, 1358 ml free water  (1958 ml total free water  including 60 ml flushes).   For home, he will need: 7.5 cartons of Osmolite 1.5 or equivalent per day    Home health orders have been placed and arranged.  The patient is medically stable for discharge home today.  Consults: Nutrition  Treatments: surgery:   10/19/2024   Patient:  Tommy Mcbride Pre-Op Dx: Esophageal cancer Dysphagia Weight loss malnutrition   Post-op Dx:  same Procedure: - Esophagogastroscopy - 52F Percutaneous gastroscopy tube placement     Surgeon and Role:      * Lightfoot, Linnie KIDD, MD - Primary Anesthesia  general EBL:  minimal  Blood Administration: none Specimen:  none   Discharge Exam: Blood pressure 115/76, pulse 83, temperature 98 F (36.7 C), temperature source Oral, resp. rate 20, height 6' (1.829 m), weight 71.6 kg, SpO2 100%.  General appearance: alert, cooperative, and no distress Heart: regular rate and rhythm Lungs: clear to auscultation bilaterally Abdomen: soft, non-tender; bowel sounds normal; no masses,  no organomegaly Wound: feeding tube, clean and dry, no erythema  Discharge disposition: 06-Home-Health Care Svc  Allergies as of 10/20/2024       Reactions   Penicillins Other (See Comments)   childhood reaction        Medication List     STOP taking these medications    Policosanol 30 MG Tabs   POLICOSANOL PO   sucralfate  1 GM/10ML suspension Commonly known as: CARAFATE    triamcinolone  cream 0.1 % Commonly known as: KENALOG        TAKE these medications    Acetaminophen  Extra Strength 500 MG/15ML Liqd Take 30 mLs by mouth 3 (three) times daily as needed.    Breyna  80-4.5 MCG/ACT inhaler Generic drug: budesonide -formoterol  Inhale 2 puffs into the lungs 2 (two) times daily as needed (respiratory issues.).   feeding supplement (OSMOLITE 1.5 CAL) Liqd Place 120 mLs into feeding tube 5 (five) times daily.   free water  Soln Place 120 mLs into feeding tube 5 (five) times daily.   lidocaine -prilocaine  cream Commonly known as: EMLA  Apply to affected area once   omeprazole  40 MG capsule Commonly known as: PRILOSEC Take 40 mg by mouth daily.   ondansetron  4 MG disintegrating tablet Commonly known as: ZOFRAN -ODT Take 1 tablet (4 mg total) by mouth every 8 (eight) hours as needed for nausea or vomiting.   ondansetron  8 MG tablet Commonly known as: Zofran  Take 1 tablet (8 mg total) by mouth every 8 (eight) hours as needed for nausea or vomiting. Start on the third day after chemotherapy.   prochlorperazine  10 MG tablet Commonly known as: COMPAZINE  Take 1 tablet (10 mg total) by mouth every 6 (six) hours as needed for nausea or vomiting.   promethazine  25 MG suppository Commonly known as: PHENERGAN  Place 1 suppository (25 mg total) rectally every 6 (  six) hours as needed for nausea or vomiting.   tadalafil 5 MG tablet Commonly known as: CIALIS Take 5 mg by mouth daily as needed for erectile dysfunction.               Durable Medical Equipment  (From admission, onward)           Start     Ordered   10/20/24 0750  For home use only DME Tube feeding pump  Once       Question:  Length of Need  Answer:  Lifetime   10/20/24 0753   10/20/24 0750  For home use only DME Tube feeding  Once        10/20/24 0753            Contact information for after-discharge care     Home Medical Care     [facility]) .   Service: Home Health Services Why: Registered Nurse-office to call with visit times. Contact information: [address] [place]  [address] [phone]                      Signed: Rocky Shad, PA-C  10/20/2024, 1:33 PM   "

## 2024-10-19 NOTE — Progress Notes (Signed)
 Patient arrived to room 6E24, VS stable and free from pain. Patient oriented to room and call bell in reach. Wife and daughter at bedside.

## 2024-10-19 NOTE — Anesthesia Procedure Notes (Signed)
 Procedure Name: Intubation Date/Time: 10/19/2024 2:02 PM  Performed by: Cindie Donald CROME, CRNAPre-anesthesia Checklist: Patient identified, Emergency Drugs available, Suction available and Patient being monitored Patient Re-evaluated:Patient Re-evaluated prior to induction Oxygen Delivery Method: Circle System Utilized Preoxygenation: Pre-oxygenation with 100% oxygen Induction Type: IV induction and Rapid sequence Laryngoscope Size: Mac and 4 Grade View: Grade I Tube type: Oral Tube size: 7.5 mm Number of attempts: 1 Airway Equipment and Method: Stylet Placement Confirmation: ETT inserted through vocal cords under direct vision, positive ETCO2 and breath sounds checked- equal and bilateral Secured at: 22 cm Tube secured with: Tape Dental Injury: Teeth and Oropharynx as per pre-operative assessment

## 2024-10-20 ENCOUNTER — Other Ambulatory Visit: Payer: Self-pay

## 2024-10-20 DIAGNOSIS — Z931 Gastrostomy status: Secondary | ICD-10-CM

## 2024-10-20 DIAGNOSIS — C155 Malignant neoplasm of lower third of esophagus: Secondary | ICD-10-CM

## 2024-10-20 DIAGNOSIS — E43 Unspecified severe protein-calorie malnutrition: Secondary | ICD-10-CM | POA: Insufficient documentation

## 2024-10-20 DIAGNOSIS — C61 Malignant neoplasm of prostate: Secondary | ICD-10-CM

## 2024-10-20 LAB — BASIC METABOLIC PANEL WITH GFR
Anion gap: 12 (ref 5–15)
BUN: 12 mg/dL (ref 8–23)
CO2: 22 mmol/L (ref 22–32)
Calcium: 8.4 mg/dL — ABNORMAL LOW (ref 8.9–10.3)
Chloride: 100 mmol/L (ref 98–111)
Creatinine, Ser: 1.05 mg/dL (ref 0.61–1.24)
GFR, Estimated: >60 mL/min
Glucose, Bld: 127 mg/dL — ABNORMAL HIGH (ref 70–99)
Potassium: 3.4 mmol/L — ABNORMAL LOW (ref 3.5–5.1)
Sodium: 134 mmol/L — ABNORMAL LOW (ref 135–145)

## 2024-10-20 LAB — GLUCOSE, CAPILLARY
Glucose-Capillary: 119 mg/dL — ABNORMAL HIGH (ref 70–99)
Glucose-Capillary: 128 mg/dL — ABNORMAL HIGH (ref 70–99)
Glucose-Capillary: 133 mg/dL — ABNORMAL HIGH (ref 70–99)
Glucose-Capillary: 146 mg/dL — ABNORMAL HIGH (ref 70–99)

## 2024-10-20 LAB — CBC
HCT: 36.9 % — ABNORMAL LOW (ref 39.0–52.0)
Hemoglobin: 12.6 g/dL — ABNORMAL LOW (ref 13.0–17.0)
MCH: 29.3 pg (ref 26.0–34.0)
MCHC: 34.1 g/dL (ref 30.0–36.0)
MCV: 85.8 fL (ref 80.0–100.0)
Platelets: 274 10*3/uL (ref 150–400)
RBC: 4.3 MIL/uL (ref 4.22–5.81)
RDW: 13.2 % (ref 11.5–15.5)
WBC: 26.9 10*3/uL — ABNORMAL HIGH (ref 4.0–10.5)
nRBC: 0 % (ref 0.0–0.2)

## 2024-10-20 MED ORDER — FREE WATER
120.0000 mL | Freq: Every day | Status: DC
  Administered 2024-10-20: 120 mL

## 2024-10-20 MED ORDER — PANTOPRAZOLE SODIUM 40 MG IV SOLR
40.0000 mg | INTRAVENOUS | Status: DC
  Administered 2024-10-20: 40 mg via INTRAVENOUS
  Filled 2024-10-20: qty 10

## 2024-10-20 MED ORDER — FREE WATER: 120.0000 mL | Freq: Every day | Status: AC

## 2024-10-20 MED ORDER — OSMOLITE 1.5 CAL PO LIQD
120.0000 mL | Freq: Every day | ORAL | Status: DC
  Administered 2024-10-20: 120 mL
  Filled 2024-10-20 (×3): qty 237

## 2024-10-20 MED ORDER — POTASSIUM CHLORIDE 20 MEQ PO PACK
40.0000 meq | PACK | Freq: Once | ORAL | Status: AC
  Administered 2024-10-20: 40 meq via ORAL
  Filled 2024-10-20: qty 2

## 2024-10-20 MED ORDER — OSMOLITE 1.5 CAL PO LIQD: 120.0000 mL | Freq: Every day | ORAL | Status: AC

## 2024-10-20 NOTE — Plan of Care (Signed)
" °  Problem: Education: Goal: Knowledge of the procedure and recovery process will improve Outcome: Progressing   Problem: Education: Goal: Knowledge of the procedure and recovery process will improve Outcome: Progressing   Problem: Bowel/Gastric: Goal: Gastrointestinal status for postoperative course will improve Outcome: Progressing   Problem: Pain Management: Goal: General experience of comfort will improve Outcome: Progressing   Problem: Skin Integrity: Goal: Demonstration of wound healing without infection will improve Outcome: Progressing   Problem: Urinary Elimination: Goal: Ability to avoid or minimize complications of infection will improve Outcome: Progressing Goal: Ability to achieve and maintain urine output will improve Outcome: Progressing Goal: Home care management will improve Outcome: Progressing   Problem: Education: Goal: Knowledge of the prescribed therapeutic regimen will improve Outcome: Progressing   Problem: Bowel/Gastric: Goal: Gastrointestinal status for postoperative course will improve Outcome: Progressing   Problem: Cardiac: Goal: Ability to maintain an adequate cardiac output Outcome: Progressing Goal: Will show no evidence of cardiac arrhythmias Outcome: Progressing   Problem: Nutritional: Goal: Will attain and maintain optimal nutritional status Outcome: Progressing   Problem: Neurological: Goal: Will regain or maintain usual level of consciousness Outcome: Progressing   Problem: Clinical Measurements: Goal: Ability to maintain clinical measurements within normal limits Outcome: Progressing Goal: Postoperative complications will be avoided or minimized Outcome: Progressing   Problem: Respiratory: Goal: Will regain and/or maintain adequate ventilation Outcome: Progressing Goal: Respiratory status will improve Outcome: Progressing   Problem: Skin Integrity: Goal: Demonstrates signs of wound healing without infection Outcome:  Progressing   Problem: Urinary Elimination: Goal: Will remain free from infection Outcome: Progressing Goal: Ability to achieve and maintain adequate urine output Outcome: Progressing   Problem: Education: Goal: Knowledge of General Education information will improve Description: Including pain rating scale, medication(s)/side effects and non-pharmacologic comfort measures Outcome: Progressing   Problem: Health Behavior/Discharge Planning: Goal: Ability to manage health-related needs will improve Outcome: Progressing   Problem: Clinical Measurements: Goal: Ability to maintain clinical measurements within normal limits will improve Outcome: Progressing Goal: Will remain free from infection Outcome: Progressing Goal: Diagnostic test results will improve Outcome: Progressing Goal: Respiratory complications will improve Outcome: Progressing Goal: Cardiovascular complication will be avoided Outcome: Progressing   Problem: Activity: Goal: Risk for activity intolerance will decrease Outcome: Progressing   Problem: Nutrition: Goal: Adequate nutrition will be maintained Outcome: Progressing   Problem: Coping: Goal: Level of anxiety will decrease Outcome: Progressing   Problem: Elimination: Goal: Will not experience complications related to bowel motility Outcome: Progressing Goal: Will not experience complications related to urinary retention Outcome: Progressing   Problem: Pain Managment: Goal: General experience of comfort will improve and/or be controlled Outcome: Progressing   Problem: Safety: Goal: Ability to remain free from injury will improve Outcome: Progressing   Problem: Skin Integrity: Goal: Risk for impaired skin integrity will decrease Outcome: Progressing   Problem: Education: Goal: Knowledge of disease or condition will improve Outcome: Progressing Goal: Knowledge of the prescribed therapeutic regimen will improve Outcome: Progressing   Problem:  Activity: Goal: Risk for activity intolerance will decrease Outcome: Progressing   Problem: Cardiac: Goal: Will achieve and/or maintain hemodynamic stability Outcome: Progressing   Problem: Clinical Measurements: Goal: Postoperative complications will be avoided or minimized Outcome: Progressing   Problem: Respiratory: Goal: Respiratory status will improve Outcome: Progressing   Problem: Pain Management: Goal: Pain level will decrease Outcome: Progressing   Problem: Skin Integrity: Goal: Wound healing without signs and symptoms infection will improve Outcome: Progressing   "

## 2024-10-20 NOTE — Progress Notes (Addendum)
" ° °   °  [address] [place] [address]             [phone]         1 Day Post-Op Procedures (LRB): EGD (ESOPHAGOGASTRODUODENOSCOPY) (N/A) INSERTION PERCUTANEOUS ENDOSCOPIC GASTROSTOMY (PEG) TUBE (N/A)  Subjective:  Patient without complaints.  States he has been able to drink some liquids recently, but food intake  Objective: Vital signs in last 24 hours: Temp:  [97.6 F (36.4 C)-98.5 F (36.9 C)] 98.1 F (36.7 C) (02/06 0341) Pulse Rate:  [57-99] 83 (02/06 0341) Cardiac Rhythm: Normal sinus rhythm (02/05 2000) Resp:  [12-20] 20 (02/06 0341) BP: (121-147)/(73-91) 121/84 (02/06 0341) SpO2:  [94 %-100 %] 100 % (02/06 0341) Weight:  [71.6 kg-73 kg] 71.6 kg (02/06 0341)  Intake/Output from previous day: 02/05 0701 - 02/06 0700 In: 1926.7 [P.O.:450; I.V.:1000; NG/GT:276.7; IV Piggyback:200] Out: 5 [Blood:5]   General appearance: alert, cooperative, and no distress Heart: regular rate and rhythm Lungs: clear to auscultation bilaterally Abdomen: soft, non-tender; bowel sounds normal; no masses,  no organomegaly Wound: feeding tube, clean and dry, no erythema  Lab Results: Recent Labs    10/18/24 1028 10/20/24 0454  WBC 11.9* 26.9*  HGB 13.9 12.6*  HCT 40.9 36.9*  PLT 266 274   BMET:  Recent Labs    10/18/24 1028 10/20/24 0454  NA 135 134*  K 3.6 3.4*  CL 98 100  CO2 23 22  GLUCOSE 109* 127*  BUN 12 12  CREATININE 1.07 1.05  CALCIUM  9.5 8.4*    PT/INR:  Recent Labs    10/19/24 0934  LABPROT 14.3  INR 1.1   ABG No results found for: PHART, HCO3, TCO2, ACIDBASEDEF, O2SAT CBG (last 3)  Recent Labs    10/19/24 2027 10/20/24 0017 10/20/24 0338  GLUCAP 107* 133* 146*    Assessment/Plan: S/P Procedures (LRB): EGD (ESOPHAGOGASTRODUODENOSCOPY) (N/A) INSERTION PERCUTANEOUS ENDOSCOPIC GASTROSTOMY (PEG) TUBE (N/A)  Esophageal Cancer Protein Calorie Malnutrition- due to inability to pass foods in setting of  known esophageal cancer... now S/P PEG tube placement GI- nutrition consult requested, currently tolerating tube feedings at 50 ml/hr will need to titrate to goal... pending nutritional recommendations Hypokalemia- mild, due to poor oral intake, will supplement ID- leukocytosis- quite an increase from pre operative labs, likely SIRS will monitor clinically Home health orders have been placed..   Patient will be ready for d/c today once nutrition recommendations are made   LOS: 1 day    Rocky Shad, PA-C 10/20/2024 7:44 AM    "

## 2024-10-20 NOTE — Care Management Obs Status (Signed)
 MEDICARE OBSERVATION STATUS NOTIFICATION   Patient Details  Name: Tommy Mcbride MRN: [id number] Date of Birth: 03/22/1956   Medicare Observation Status Notification Given:  Yes    Vonzell Arrie Sharps 10/20/2024, 9:15 AM

## 2024-10-20 NOTE — Progress Notes (Signed)
 Verbal order w/readback from Dr Lanny for labs CBC w/Diff; CMP; Mg+; K+; and Phos.  Orders placed and Chloe - Dr Demetra Dings will be contacting the pt to get the lab appt scheduled.

## 2024-10-20 NOTE — TOC Initial Note (Signed)
 Transition of Care [facility]) - Initial/Assessment Note    Patient Details  Name: Tommy Mcbride MRN: [id number] Date of Birth: Jan 10, 1956  Transition of Care [facility]) CM/SW Contact:    Sudie Erminio Deems, RN Phone Number: 10/20/2024, 1:12 PM  Clinical Narrative: Patient with esophageal cancer-post peg tube placement. Plan for home with tube feeds. PTA patient was from home with spouse and daughter. ICM spoke with patient regarding home health services (medicare.gov list) and he does not have an agency preference. ICM did submit the referral via the hub and [facility] has accepted the services. Patient is aware that the agency will call with visit times. Staff RN to educate patient and spouse regarding tube feeds and flushes. ICM has contacted [facility] and they will service the patient with tube feeds, enteral feeding kit, and additional supplies. PA has signed orders. [facility] hopes to have supplies delivered today so patient can discharge home. ICM continues to follow for additional needs.    Expected Discharge Plan: Home w Home Health Services Barriers to Discharge: No Barriers Identified   Patient Goals and CMS Choice Patient states their goals for this hospitalization and ongoing recovery are:: Plan to return home with home health services   Choice offered to / list presented to : Patient (Patient did not have an agency preference-referral submitted via the hub)      Expected Discharge Plan and Services In-house Referral: NA Discharge Planning Services: CM Consult Post Acute Care Choice: Home Health Living arrangements for the past 2 months: Single Family Home                 DME Arranged: Tube feeding, Tube feeding pump DME Agency:  [facility]) Date DME Agency Contacted: 10/20/24 Time DME Agency Contacted: 0900 Representative spoke with at DME Agency: Hol[facility] Herring HH Arranged: RN HH Agency: [facility] Date [facility] Agency Contacted: 10/20/24 Time HH Agency Contacted:  1100 Representative spoke with at [facility] Agency: Hub  Prior Living Arrangements/Services Living arrangements for the past 2 months: Single Family Home Lives with:: Spouse, Adult Children Patient language and need for interpreter reviewed:: Yes Do you feel safe going back to the place where you live?: Yes      Need for Family Participation in Patient Care: Yes (Comment) Care giver support system in place?: Yes (comment)   Criminal Activity/Legal Involvement Pertinent to Current Situation/Hospitalization: No - Comment as needed  Activities of Daily Living   ADL Screening (condition at time of admission) Independently performs ADLs?: Yes (appropriate for developmental age) Is the patient deaf or have difficulty hearing?: No Does the patient have difficulty seeing, even when wearing glasses/contacts?: No Does the patient have difficulty concentrating, remembering, or making decisions?: No  Permission Sought/Granted Permission sought to share information with : Case Manager, Family Supports Permission granted to share information with : Yes, Verbal Permission Granted     Permission granted to share info w AGENCY: [facility]        Emotional Assessment Appearance:: Appears stated age Attitude/Demeanor/Rapport: Engaged Affect (typically observed): Appropriate Orientation: : Oriented to Self, Oriented to Place, Oriented to  Time, Oriented to Situation Alcohol / Substance Use: Not Applicable Psych Involvement: No (comment)  Admission diagnosis:  Malignant neoplasm of esophagus, unspecified location [facility]) [C15.9] S/P laparoscopic procedure [S01.109] Patient Active Problem List   Diagnosis Date Noted   S/P laparoscopic procedure 10/19/2024   Dehydration 10/11/2024   Nausea without vomiting 10/11/2024   Esophageal cancer (HCC) 09/28/2024   Genetic testing 08/16/2024   Dysphagia  08/08/2024   Prostate cancer (HCC) 04/27/2024   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   Paroxysmal SVT (supraventricular tachycardia) 03/12/2022   PCP NOTES >>>>>>>>>>>>>>>> 11/26/2015   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   Annual physical exam 09/24/2014   DJD (degenerative joint disease)    Hyperlipidemia    PCP:  Amon Aloysius BRAVO, MD Pharmacy:   [facility] [id number] - [place], [place] - [address] [place] [address] Phone: [phone] Fax: [phone]     Social Drivers of Health (SDOH) Social History: SDOH Screenings   Food Insecurity: No Food Insecurity (10/19/2024)  Housing: Low Risk (10/19/2024)  Transportation Needs: No Transportation Needs (10/19/2024)  Utilities: Not At Risk (10/19/2024)  Alcohol Screen: Low Risk (08/07/2024)  Depression (PHQ2-9): Low Risk (10/13/2024)  Financial Resource Strain: Low Risk (08/07/2024)  Physical Activity: Sufficiently Active (08/07/2024)  Social Connections: Unknown (10/19/2024)  Stress: No Stress Concern Present (08/07/2024)  Tobacco Use: Low Risk (10/19/2024)  Health Literacy: Adequate Health Literacy (03/10/2024)   SDOH Interventions:     Readmission Risk Interventions     No data to display

## 2024-10-20 NOTE — Discharge Instructions (Signed)
 Flush PEG tube with 30 cc of water  daily, or after tube feedings/medication administration  Care: please clean around PEG tube daily with mild soap and water , keep dry... you may shower in 48 hours, however please avoid saturating PEG tube area, may benefit from placing water  occlusive dressing over site  Activity- up as tolerated  Please contact our office should you develop drainage from around PEG tube

## 2024-10-20 NOTE — Progress Notes (Addendum)
 Initial Nutrition Assessment  DOCUMENTATION CODES:  Severe malnutrition in context of acute illness/injury  INTERVENTION:  Stop continuous tube feeding; transition to bolus feedings via PEG: Osmolite 1.5 120 ml (1/2 carton) 5 times per day, increase by 120 ml every 3 days to goal of 360 ml (1.5 cartons) 5 times per day. Free water  flushes 60 ml before and after each bolus. Will need an additional 550 ml water  via tube divided throughout the day if unable to take in that much PO.  Provides 2663 kcal, 112 gm protein, 1358 ml free water  (1958 ml total free water  including 60 ml flushes).  Will need to monitor magnesium , potassium, and phosphorus as an outpatient, MD to replete as needed, as pt is at risk for refeeding syndrome given severe malnutrition. Oncology to set up labs for next week at the cancer center.   For home, he will need: 7.5 cartons of Osmolite 1.5 or equivalent per day  NUTRITION DIAGNOSIS:  Severe Malnutrition related to acute illness (newly diagnosed esophageal cancer) as evidenced by severe muscle depletion, energy intake < or equal to 50% for > or equal to 5 days, percent weight loss (13% weight loss within 3 months).  GOAL:  Patient will meet greater than or equal to 90% of their needs  MONITOR:  TF tolerance, PO intake  REASON FOR ASSESSMENT:  Consult Enteral/tube feeding initiation and management  ASSESSMENT:  69 yo male admitted 2/5 for PEG tube placement. PMH includes esophageal cancer, recently started on chemotherapy, progressive dysphagia, chemotherapy induced nausea & vomiting, HLD, DJD, prostate cancer, kidney stones.  Currently receiving Osmolite 1.2 at 50 ml/h, tolerating well. Suspect decrease in K is related to refeeding syndrome as patient is severely malnourished with minimal intake for several weeks and weight loss of 10 lbs in the past week.  Weight history reviewed.  08/08/24 82.1 kg 10/06/24 78.4 kg 10/20/24 71.6 kg 9% weight loss within  the past 2 weeks 13% weight loss within the past 3 months  Patient meets criteria for severe malnutrition, given severe depletion of muscle mass, severe weight loss, and intake meeting < 50% of estimated energy requirement for > 5 days.  Labs reviewed.  Na 134 K 3.6-->3.4 CBG: [id number]  Medications reviewed and include reglan , protonix , Klor-con .  NUTRITION - FOCUSED PHYSICAL EXAM: Flowsheet Row Most Recent Value  Orbital Region No depletion  Upper Arm Region Mild depletion  Thoracic and Lumbar Region Mild depletion  Buccal Region Mild depletion  Temple Region Severe depletion  Clavicle Bone Region Severe depletion  Clavicle and Acromion Bone Region Severe depletion  Scapular Bone Region Severe depletion  Dorsal Hand Severe depletion  Patellar Region Unable to assess  Anterior Thigh Region Unable to assess  Posterior Calf Region Severe depletion  Edema (RD Assessment) None  Hair Reviewed  Eyes Reviewed  Mouth Reviewed  Skin Reviewed  Nails Reviewed   Diet Order:   Diet Order             Diet clear liquid Room service appropriate? Yes; Fluid consistency: Thin  Diet effective now                  EDUCATION NEEDS:  Education needs have been addressed  Skin:  Skin Assessment: Reviewed RN Assessment  Last BM:  no BM documented  Height:  Ht Readings from Last 1 Encounters:  10/19/24 6' (1.829 m)   Weight:  Wt Readings from Last 1 Encounters:  10/20/24 71.6 kg   Ideal Body Weight:  80.9 kg  BMI:  Body mass index is 21.4 kg/m.  Estimated Nutritional Needs:  Kcal:  2200-2500 Protein:  110-130 gm Fluid:  2.2-2.5 L   Suzen HUNT RD, LDN, CNSC Contact via secure chat. If unavailable, use group chat RD Inpatient.

## 2024-10-23 ENCOUNTER — Inpatient Hospital Stay

## 2024-10-23 ENCOUNTER — Inpatient Hospital Stay: Admitting: Hematology

## 2024-10-24 ENCOUNTER — Inpatient Hospital Stay: Admitting: Dietician

## 2024-10-24 ENCOUNTER — Inpatient Hospital Stay

## 2024-10-27 ENCOUNTER — Inpatient Hospital Stay

## 2024-10-30 ENCOUNTER — Inpatient Hospital Stay

## 2024-10-30 ENCOUNTER — Inpatient Hospital Stay: Admitting: Hematology

## 2024-11-01 ENCOUNTER — Inpatient Hospital Stay

## 2024-11-03 ENCOUNTER — Telehealth: Admitting: Thoracic Surgery (Cardiothoracic Vascular Surgery)

## 2024-11-06 ENCOUNTER — Inpatient Hospital Stay

## 2024-11-06 ENCOUNTER — Inpatient Hospital Stay: Admitting: Hematology

## 2024-11-07 ENCOUNTER — Inpatient Hospital Stay

## 2024-11-13 ENCOUNTER — Inpatient Hospital Stay

## 2024-11-13 ENCOUNTER — Inpatient Hospital Stay: Admitting: Hematology

## 2024-11-15 ENCOUNTER — Inpatient Hospital Stay

## 2024-11-20 ENCOUNTER — Inpatient Hospital Stay

## 2024-11-20 ENCOUNTER — Inpatient Hospital Stay: Admitting: Hematology

## 2024-11-21 ENCOUNTER — Inpatient Hospital Stay

## 2024-11-27 ENCOUNTER — Inpatient Hospital Stay: Admitting: Hematology

## 2024-11-27 ENCOUNTER — Inpatient Hospital Stay

## 2024-11-29 ENCOUNTER — Inpatient Hospital Stay

## 2024-12-07 ENCOUNTER — Encounter (HOSPITAL_COMMUNITY): Payer: Self-pay

## 2024-12-07 ENCOUNTER — Ambulatory Visit (HOSPITAL_COMMUNITY): Admit: 2024-12-07 | Admitting: Gastroenterology

## 2025-03-23 ENCOUNTER — Ambulatory Visit

## 2025-10-05 ENCOUNTER — Encounter: Admitting: Internal Medicine
